# Patient Record
Sex: Female | Born: 1955 | Race: Black or African American | Hispanic: No | Smoking: Never smoker
Health system: Southern US, Community
[De-identification: ages and names within clinical notes are randomized; demographics above are authoritative.]

## PROBLEM LIST (undated history)

## (undated) HISTORY — PX: APPENDECTOMY: SHX54

## (undated) HISTORY — PX: OTHER SURGICAL HISTORY: SHX169

---

## 2002-08-03 ENCOUNTER — Ambulatory Visit (HOSPITAL_COMMUNITY): Admission: RE | Admit: 2002-08-03 | Discharge: 2002-08-03 | Payer: Self-pay | Admitting: Family Medicine

## 2004-06-24 ENCOUNTER — Ambulatory Visit: Payer: Self-pay | Admitting: Nurse Practitioner

## 2004-06-24 ENCOUNTER — Ambulatory Visit: Payer: Self-pay | Admitting: Internal Medicine

## 2004-06-25 ENCOUNTER — Ambulatory Visit: Payer: Self-pay | Admitting: *Deleted

## 2004-08-12 ENCOUNTER — Ambulatory Visit: Payer: Self-pay | Admitting: Nurse Practitioner

## 2004-08-22 ENCOUNTER — Ambulatory Visit (HOSPITAL_COMMUNITY): Admission: RE | Admit: 2004-08-22 | Discharge: 2004-08-22 | Payer: Self-pay | Admitting: Family Medicine

## 2004-09-01 ENCOUNTER — Ambulatory Visit: Payer: Self-pay | Admitting: Nurse Practitioner

## 2004-09-25 ENCOUNTER — Ambulatory Visit: Payer: Self-pay | Admitting: Nurse Practitioner

## 2004-10-09 ENCOUNTER — Ambulatory Visit: Payer: Self-pay | Admitting: Nurse Practitioner

## 2005-05-14 ENCOUNTER — Ambulatory Visit: Payer: Self-pay | Admitting: Nurse Practitioner

## 2016-01-23 ENCOUNTER — Encounter (HOSPITAL_COMMUNITY): Payer: Self-pay | Admitting: Nurse Practitioner

## 2016-01-23 ENCOUNTER — Inpatient Hospital Stay (HOSPITAL_COMMUNITY)
Admission: AD | Admit: 2016-01-23 | Discharge: 2016-01-28 | DRG: 885 | Disposition: A | Discharge status: Home / Self Care | Payer: Federal, State, Local not specified - Other | Attending: Psychiatry | Admitting: Psychiatry

## 2016-01-23 ENCOUNTER — Emergency Department (HOSPITAL_COMMUNITY): Payer: Self-pay

## 2016-01-23 ENCOUNTER — Emergency Department (HOSPITAL_COMMUNITY)
Admission: EM | Admit: 2016-01-23 | Discharge: 2016-01-23 | Disposition: A | Discharge status: Other Acute Inpatient Hospital | Payer: Self-pay | Attending: Emergency Medicine | Admitting: Emergency Medicine

## 2016-01-23 DIAGNOSIS — R440 Auditory hallucinations: Secondary | ICD-10-CM | POA: Diagnosis present

## 2016-01-23 DIAGNOSIS — F333 Major depressive disorder, recurrent, severe with psychotic symptoms: Secondary | ICD-10-CM | POA: Diagnosis present

## 2016-01-23 DIAGNOSIS — F312 Bipolar disorder, current episode manic severe with psychotic features: Secondary | ICD-10-CM | POA: Diagnosis present

## 2016-01-23 DIAGNOSIS — G4700 Insomnia, unspecified: Secondary | ICD-10-CM | POA: Diagnosis present

## 2016-01-23 DIAGNOSIS — Z803 Family history of malignant neoplasm of breast: Secondary | ICD-10-CM | POA: Diagnosis not present

## 2016-01-23 DIAGNOSIS — R000 Tachycardia, unspecified: Secondary | ICD-10-CM | POA: Insufficient documentation

## 2016-01-23 DIAGNOSIS — F411 Generalized anxiety disorder: Secondary | ICD-10-CM | POA: Diagnosis present

## 2016-01-23 DIAGNOSIS — F29 Unspecified psychosis not due to a substance or known physiological condition: Secondary | ICD-10-CM | POA: Insufficient documentation

## 2016-01-23 DIAGNOSIS — Z5181 Encounter for therapeutic drug level monitoring: Secondary | ICD-10-CM | POA: Insufficient documentation

## 2016-01-23 DIAGNOSIS — Z79899 Other long term (current) drug therapy: Secondary | ICD-10-CM | POA: Diagnosis not present

## 2016-01-23 LAB — URINALYSIS, ROUTINE W REFLEX MICROSCOPIC
Bilirubin Urine: NEGATIVE
Glucose, UA: NEGATIVE mg/dL
KETONES UR: 40 mg/dL — AB
LEUKOCYTES UA: NEGATIVE
NITRITE: NEGATIVE
PH: 5.5 (ref 5.0–8.0)
Protein, ur: NEGATIVE mg/dL
SPECIFIC GRAVITY, URINE: 1.009 (ref 1.005–1.030)

## 2016-01-23 LAB — COMPREHENSIVE METABOLIC PANEL
ALK PHOS: 105 U/L (ref 38–126)
ALT: 51 U/L (ref 14–54)
AST: 40 U/L (ref 15–41)
Albumin: 4.8 g/dL (ref 3.5–5.0)
Anion gap: 10 (ref 5–15)
BUN: 19 mg/dL (ref 6–20)
CALCIUM: 9.8 mg/dL (ref 8.9–10.3)
CO2: 24 mmol/L (ref 22–32)
CREATININE: 0.73 mg/dL (ref 0.44–1.00)
Chloride: 103 mmol/L (ref 101–111)
Glucose, Bld: 103 mg/dL — ABNORMAL HIGH (ref 65–99)
Potassium: 3 mmol/L — ABNORMAL LOW (ref 3.5–5.1)
Sodium: 137 mmol/L (ref 135–145)
Total Bilirubin: 2.2 mg/dL — ABNORMAL HIGH (ref 0.3–1.2)
Total Protein: 7.4 g/dL (ref 6.5–8.1)

## 2016-01-23 LAB — CBC WITH DIFFERENTIAL/PLATELET
BASOS PCT: 0 %
Basophils Absolute: 0 10*3/uL (ref 0.0–0.1)
Eosinophils Absolute: 0 10*3/uL (ref 0.0–0.7)
Eosinophils Relative: 0 %
HEMATOCRIT: 41 % (ref 36.0–46.0)
HEMOGLOBIN: 13.7 g/dL (ref 12.0–15.0)
Lymphocytes Relative: 16 %
Lymphs Abs: 1.3 10*3/uL (ref 0.7–4.0)
MCH: 29.7 pg (ref 26.0–34.0)
MCHC: 33.4 g/dL (ref 30.0–36.0)
MCV: 88.7 fL (ref 78.0–100.0)
MONOS PCT: 6 %
Monocytes Absolute: 0.5 10*3/uL (ref 0.1–1.0)
NEUTROS ABS: 6.3 10*3/uL (ref 1.7–7.7)
NEUTROS PCT: 78 %
Platelets: 233 10*3/uL (ref 150–400)
RBC: 4.62 MIL/uL (ref 3.87–5.11)
RDW: 12.8 % (ref 11.5–15.5)
WBC: 8 10*3/uL (ref 4.0–10.5)

## 2016-01-23 LAB — RAPID URINE DRUG SCREEN, HOSP PERFORMED
Amphetamines: NOT DETECTED
Barbiturates: NOT DETECTED
Benzodiazepines: NOT DETECTED
Cocaine: NOT DETECTED
OPIATES: NOT DETECTED
TETRAHYDROCANNABINOL: NOT DETECTED

## 2016-01-23 LAB — URINE MICROSCOPIC-ADD ON

## 2016-01-23 LAB — ETHANOL

## 2016-01-23 LAB — TSH: TSH: 0.486 u[IU]/mL (ref 0.350–4.500)

## 2016-01-23 MED ORDER — SODIUM CHLORIDE 0.9 % IV BOLUS (SEPSIS)
1000.0000 mL | Freq: Once | INTRAVENOUS | Status: AC
  Administered 2016-01-23: 1000 mL via INTRAVENOUS

## 2016-01-23 MED ORDER — ALUM & MAG HYDROXIDE-SIMETH 200-200-20 MG/5ML PO SUSP: 30.0000 mL | ORAL | Status: DC | PRN

## 2016-01-23 MED ORDER — IBUPROFEN 200 MG PO TABS: 600.0000 mg | ORAL_TABLET | Freq: Three times a day (TID) | ORAL | Status: DC | PRN

## 2016-01-23 MED ORDER — LORAZEPAM 1 MG PO TABS: 1.0000 mg | ORAL_TABLET | Freq: Three times a day (TID) | ORAL | Status: DC | PRN

## 2016-01-23 MED ORDER — ACETAMINOPHEN 325 MG PO TABS
650.0000 mg | ORAL_TABLET | ORAL | Status: DC | PRN
  Administered 2016-01-27: 650 mg via ORAL
  Filled 2016-01-23: qty 2

## 2016-01-23 MED ORDER — MAGNESIUM HYDROXIDE 400 MG/5ML PO SUSP
30.0000 mL | Freq: Every day | ORAL | Status: DC | PRN
  Administered 2016-01-24 – 2016-01-25 (×2): 30 mL via ORAL
  Filled 2016-01-23 (×2): qty 30

## 2016-01-23 MED ORDER — ACETAMINOPHEN 325 MG PO TABS
650.0000 mg | ORAL_TABLET | ORAL | Status: DC | PRN
  Administered 2016-01-23: 650 mg via ORAL
  Filled 2016-01-23: qty 2

## 2016-01-23 MED ORDER — IBUPROFEN 600 MG PO TABS
600.0000 mg | ORAL_TABLET | Freq: Three times a day (TID) | ORAL | Status: DC | PRN
  Administered 2016-01-27: 600 mg via ORAL
  Filled 2016-01-23: qty 1

## 2016-01-23 MED ORDER — POTASSIUM CHLORIDE CRYS ER 20 MEQ PO TBCR
40.0000 meq | EXTENDED_RELEASE_TABLET | Freq: Once | ORAL | Status: AC
  Administered 2016-01-23: 40 meq via ORAL
  Filled 2016-01-23: qty 2

## 2016-01-23 NOTE — BH Assessment (Addendum)
Tele Assessment Note   Rollen SoxJoyce Hopman is an 60 y.o. female. Pt denies SI/HI. Pt reports auditory hallucinations. Per Pt she hears a voice "talking foul and filthy." Pt states the voice is negative and tries to lower her self-esteem. Pt states the voice will not allow her to sleep. Pt states that she is hearing the voice because "the world is evil." Pt attempted to file a police report because she feels that someone is trying to have mind control over her. Pt denies previous mental health history. Pt denies mental health medication. Pt states informed her family about the voice and they became very concerned about her. Pt denies SA and abuse.   Pt's family state the Pt has been talking back to the voice and wondering off.   Writer consulted with Catha NottinghamJamison, DNP. Per Catha NottinghamJamison Pt meets inpatient criteria. TTS to seek gero-psych.   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 No pertinent past medical history.  History reviewed. No pertinent surgical history.  Family History: History reviewed. No pertinent family history.  Social History:  reports that she does not drink alcohol or use drugs. Her tobacco history is not on file.  Additional Social History:  Alcohol / Drug Use Pain Medications: Pt denies Prescriptions: pt denies Over the Counter: Pt denies History of alcohol / drug use?: No history of alcohol / drug abuse Longest period of sobriety (when/how long): NA  CIWA: CIWA-Ar BP: 146/98 Pulse Rate: (!) 125 COWS:    PATIENT STRENGTHS: (choose at least two) Average or above average intelligence Communication skills  Allergies: No Known Allergies  Home Medications:  (Not in a hospital admission)  OB/GYN Status:  No LMP recorded. Patient has had a hysterectomy.  General Assessment Data [facility] of [facility]: [facility] TTS Assessment: In system Is this a Tele or Face-to-Face Assessment?: Face-to-Face Is this an Initial Assessment or a Re-assessment  for this encounter?: Initial Assessment Marital status: Divorced BoazMaiden name: NA Is patient pregnant?: No Pregnancy Status: No Living Arrangements: Children Can pt return to current living arrangement?: Yes Admission Status: Voluntary Is patient capable of signing voluntary admission?: Yes Referral Source: Self/Family/Friend Insurance type: SP     Crisis Care Plan Living Arrangements: Children Legal Guardian: Other: (self) Name of Psychiatrist: Na Name of Therapist: NA  Education Status Is patient currently in school?: No Current Grade: NA Highest grade of school patient has completed: 12 Name of school: NA Contact person: NA  Risk to self with the past 6 months Suicidal Ideation: No Has patient been a risk to self within the past 6 months prior to admission? : No Suicidal Intent: No Has patient had any suicidal intent within the past 6 months prior to admission? : No Is patient at risk for suicide?: No Suicidal Plan?: No Has patient had any suicidal plan within the past 6 months prior to admission? : No Access to Means: No What has been your use of drugs/alcohol within the last 12 months?: NA Previous Attempts/Gestures: No How many times?: 0 Other Self Harm Risks: Na Triggers for Past Attempts: None known Intentional Self Injurious Behavior: None Family Suicide History: No Recent stressful life event(s): Other (Comment) (Pt denies) Persecutory voices/beliefs?: Yes Depression: No Depression Symptoms:  (Pt denies) Substance abuse history and/or treatment for substance abuse?: No Suicide prevention information given to non-admitted patients: Not applicable  Risk to Others within the past 6 months Homicidal Ideation: No Does patient have any lifetime risk of violence toward others beyond the  six months prior to admission? : No Thoughts of Harm to Others: No Current Homicidal Intent: No Current Homicidal Plan: No Access to Homicidal Means: No Identified Victim:  NA History of harm to others?: No Assessment of Violence: None Noted Violent Behavior Description: NA Does patient have access to weapons?: No Criminal Charges Pending?: No Does patient have a court date: No Is patient on probation?: No  Psychosis Hallucinations: None noted Delusions: None noted  Mental Status Report Appearance/Hygiene: Unremarkable Eye Contact: Fair Motor Activity: Freedom of movement Speech: Logical/coherent Level of Consciousness: Alert Mood: Euthymic Affect: Appropriate to circumstance Anxiety Level: None Thought Processes: Coherent, Relevant Judgement: Unimpaired Orientation: Person, Time, Place, Situation Obsessive Compulsive Thoughts/Behaviors: None  Cognitive Functioning Concentration: Normal Memory: Recent Intact, Remote Intact IQ: Average Insight: Fair Impulse Control: Fair Appetite: Poor Weight Loss: 0 Weight Gain: 0 Sleep: Decreased Total Hours of Sleep: 6 Vegetative Symptoms: None  ADLScreening [facility] Assessment Services) Patient's cognitive ability adequate to safely complete daily activities?: Yes Patient able to express need for assistance with ADLs?: Yes Independently performs ADLs?: Yes (appropriate for developmental age)  Prior Inpatient Therapy Prior Inpatient Therapy: No Prior Therapy Dates: Na Prior Therapy Facilty/Provider(s): NA Reason for Treatment: NA  Prior Outpatient Therapy Prior Outpatient Therapy: No Prior Therapy Dates: Na Prior Therapy Facilty/Provider(s): NA Reason for Treatment: NA Does patient have an ACCT team?: No Does patient have Intensive In-House Services?  : No Does patient have [facility] services? : No Does patient have P4CC services?: No  ADL Screening (condition at time of admission) Patient's cognitive ability adequate to safely complete daily activities?: Yes Is the patient deaf or have difficulty hearing?: No Does the patient have difficulty seeing, even when wearing glasses/contacts?:  No Does the patient have difficulty concentrating, remembering, or making decisions?: No Patient able to express need for assistance with ADLs?: Yes Does the patient have difficulty dressing or bathing?: No Independently performs ADLs?: Yes (appropriate for developmental age) Does the patient have difficulty walking or climbing stairs?: No Weakness of Legs: None Weakness of Arms/Hands: None       Abuse/Neglect Assessment (Assessment to be complete while patient is alone) Physical Abuse: Denies Verbal Abuse: Denies Sexual Abuse: Denies Exploitation of patient/patient's resources: Denies Self-Neglect: Denies     Merchant navy officerAdvance Directives (For Healthcare) Does patient have an advance directive?: No Would patient like information on creating an advanced directive?: No - patient declined information    Additional Information 1:1 In Past 12 Months?: No CIRT Risk: No Elopement Risk: No Does patient have medical clearance?: No     Disposition:  Disposition Initial Assessment Completed for this Encounter: Yes Disposition of Patient: Inpatient treatment program Type of inpatient treatment program: Adult  Emmit PomfretLevette,Shenique Childers D 01/23/2016 12:46 PM

## 2016-01-23 NOTE — ED Triage Notes (Signed)
Patients friend asked for her to be IVC. States patient has not been eating or sleeping and wonders. States patient is hearing voices and talking to imaginary people and saying that someone is in her head and touching her.

## 2016-01-23 NOTE — BH Assessment (Signed)
[facility] Assessment Progress Note  Per Nanine MeansJamison Lord, DNP, this pt requires psychiatric hospitalization at this time.  Lillia AbedLindsay, RN, [facility]; they will be ready to receive pt at 19:30.  Pt has signed Voluntary Admission and Consent for Treatment, as well as Consent to Release Information to no one, and signed [facility].  Pt's nurse has been notified, and agrees to send original paperwork along with pt via [place], and to call report to [phone].  Doylene Canninghomas Celena Lanius, MA Triage Specialist [phone]

## 2016-01-23 NOTE — Progress Notes (Signed)
Entered in d/c instructions Please use the resources provided to you in emergency room by case manager to assist you're your choice of doctor for follow up     These [place] uninsured resources provide possible primary care providers, resources for discounted medications, housing, dental resources, affordable care act information, plus other resources [place]    Instructions: please use the resources provided to your by the partnership for community care staff member on 01/23/16 in [facility]

## 2016-01-23 NOTE — ED Provider Notes (Signed)
[facility]Y DEPT Provider Note   CSN: [id number] Arrival date & time: 01/23/16  [id number]     History   Chief Complaint Chief Complaint  Patient presents with  . IVC   Level 5 caveat due to psychiatric disorder. HPI Selena [name] is a 60 y.o. female.  The history is provided by the patient.  Patient presents under involuntary commitment. Reportedly has been hallucinating for the last few months. Has not been eating sleeping. Has been hearing voices. States it is a female voice. States she's never had problems with this before the last few months. States that it is saying bad things to her. She denies substance abuse. States she's been researching on the Internet a lot of people are dealing with this. Denies headache. Denies confusion. IVC paperwork states that she was found out walking in her nightgown today. She does not have a doctor that she sees.     History reviewed. No pertinent past medical history.  There are no active problems to display for this patient.   History reviewed. No pertinent surgical history.  OB History    No data available       Home Medications    Prior to Admission medications   Not on File    Family History History reviewed. No pertinent family history.  Social History Social History  Substance Use Topics  . Smoking status: Unknown If Ever Smoked  . Smokeless tobacco: Not on file  . Alcohol use No     Allergies   Review of patient's allergies indicates no known allergies.   Review of Systems Review of Systems  Unable to perform ROS: Psychiatric disorder  Constitutional: Positive for appetite change.  Neurological: Negative for headaches.  Psychiatric/Behavioral: Positive for confusion.     Physical Exam Updated Vital Signs BP 160/84   Pulse (!) 125   Temp 98.3 F (36.8 C) (Oral)   Resp 16   SpO2 99%   Physical Exam  Constitutional: She is oriented to person, place, and time. She appears well-developed and  well-nourished.  HENT:  Head: Atraumatic.  Eyes: Pupils are equal, round, and reactive to light.  Neck: Neck supple.  Cardiovascular:  No murmur heard. tachycardia  Pulmonary/Chest: Effort normal.  Abdominal: Soft. There is no tenderness.  Musculoskeletal: She exhibits no edema.  Neurological: She is alert and oriented to person, place, and time.  Skin: Skin is warm. Capillary refill takes less than 2 seconds.  Psychiatric: She has a normal mood and affect.     ED Treatments / Results  Labs (all labs ordered are listed, but only abnormal results are displayed) Labs Reviewed  COMPREHENSIVE METABOLIC PANEL - Abnormal; Notable for the following:       Result Value   Potassium 3.0 (*)    Glucose, Bld 103 (*)    Total Bilirubin 2.2 (*)    All other components within normal limits  URINALYSIS, ROUTINE W REFLEX MICROSCOPIC (NOT AT [facility]) - Abnormal; Notable for the following:    Hgb urine dipstick TRACE (*)    Ketones, ur 40 (*)    All other components within normal limits  URINE MICROSCOPIC-ADD ON - Abnormal; Notable for the following:    Squamous Epithelial / LPF 0-5 (*)    Bacteria, UA RARE (*)    All other components within normal limits  ETHANOL  RAPID URINE DRUG SCREEN, HOSP PERFORMED  CBC WITH DIFFERENTIAL/PLATELET  TSH    EKG  EKG Interpretation  Date/Time:  Thursday January 23 2016 09:43:16  EDT Ventricular Rate:  126 PR Interval:    QRS Duration: 81 QT Interval:  295 QTC Calculation: 427 R Axis:   36 Text Interpretation:  Sinus tachycardia Confirmed by Rubin PayorPICKERING  MD, Harrold DonathNATHAN [phone]) on 01/23/2016 9:56:08 AM Also confirmed by Rubin PayorPICKERING  MD, Alexey Rhoads [phone]), editor DelcambreLOGAN, Cala BradfordKIMBERLY [phone])  on 01/23/2016 10:42:49 AM       Radiology Dg Chest 2 View  Result Date: 01/23/2016 CLINICAL DATA:  Mental status change.  Confusion. EXAM: CHEST  2 VIEW COMPARISON:  None in PACs FINDINGS: The lungs are well-expanded and clear. The heart and pulmonary vascularity are normal.  There is calcification in the wall of the aortic arch. There is no pleural effusion or pneumothorax. The bony thorax is unremarkable. IMPRESSION: There is no active cardiopulmonary disease. Aortic atherosclerosis. Electronically Signed   By: David  Swazil[place]Jordan M.D.   On: 01/23/2016 10:05   Ct Head Wo Contrast  Result Date: 01/23/2016 CLINICAL DATA:  Patients friend asked for her to be IVC. States patient has not been eating or sleeping and wonders. States patient is hearing voices and talking to imaginary people and saying that someone is in her head and touching her. EXAM: CT HEAD WITHOUT CONTRAST TECHNIQUE: Contiguous axial images were obtained from the base of the skull through the vertex without intravenous contrast. COMPARISON:  None. FINDINGS: Brain: No evidence of acute infarction, hemorrhage, hydrocephalus, extra-axial collection or mass lesion/mass effect. Vascular: No hyperdense vessel or unexpected calcification. Skull: Normal. Negative for fracture or focal lesion. Sinuses/Orbits: Clear visualized sinuses. Visualize globes and orbits are unremarkable. Other: None. IMPRESSION: Normal unenhanced CT scan of the brain. Electronically Signed   By: Amie Portlandavid  Ormond M.D.   On: 01/23/2016 10:27    Procedures Procedures (including critical care time)  Medications Ordered in ED Medications  ibuprofen (ADVIL,MOTRIN) tablet 600 mg (not administered)  acetaminophen (TYLENOL) tablet 650 mg (not administered)  LORazepam (ATIVAN) tablet 1 mg (not administered)  sodium chloride 0.9 % bolus 1,000 mL (0 mLs Intravenous Stopped 01/23/16 1158)     Initial Impression / Assessment and Plan / ED Course  I have reviewed the triage vital signs and the nursing notes.  Pertinent labs & imaging results that were available during my care of the patient were reviewed by me and considered in my medical decision making (see chart for details).  Clinical Course    Patient with hallucinations. No reported psychiatric  history. Appears medically cleared. To be seen by TTS.  Final Clinical Impressions(s) / ED Diagnoses   Final diagnoses:  Auditory hallucinations    New Prescriptions New Prescriptions   No medications on file     Benjiman CoreNathan Brookelynn Hamor, MD 01/23/16 1401

## 2016-01-23 NOTE — ED Notes (Signed)
Pt personal belongings moved from locker 27 to locker 31.

## 2016-01-23 NOTE — ED Notes (Signed)
Patient brought in by [employer]. Patient was IVC by a friend. He states she has been talking to herself and has imaginary friends.

## 2016-01-23 NOTE — Progress Notes (Signed)
[facility] staff this am Pt with resources from [facility] on her bedside table Pt confirms no pcp and she will use resources to find a pcp for f/u care   CM discussed and provided written information to assist pt with determining choice for uninsured accepting pcps, discussed the importance of pcp vs EDP services for f/u care, www.needymeds.org, www.goodrx.com, discounted pharmacies and other [place] resources such as [facility] , [facility], affordable care act, financial assistance, uninsured dental services, [place] med assist, DSS and  health department  Reviewed resources for [place] uninsured accepting pcps like [facility], family medicine [facility] [address], [facility], local urgent care centers, [facility], [facility], general medical clinics, [facility] services of the [place], [facility] plus others, medication resources, [facility] out [facility]ies and housing Pt voiced understanding and appreciation of resources provided   Provided P4CC contact information  Pt with female visitor at her bedside Pleasant and cooperative

## 2016-01-24 ENCOUNTER — Encounter (HOSPITAL_COMMUNITY): Payer: Self-pay

## 2016-01-24 DIAGNOSIS — F312 Bipolar disorder, current episode manic severe with psychotic features: Secondary | ICD-10-CM | POA: Clinically undetermined

## 2016-01-24 MED ORDER — OLANZAPINE 10 MG IM SOLR: 5.0000 mg | Freq: Three times a day (TID) | INTRAMUSCULAR | Status: DC | PRN

## 2016-01-24 MED ORDER — OLANZAPINE 5 MG PO TABS: 5.0000 mg | ORAL_TABLET | Freq: Three times a day (TID) | ORAL | Status: DC | PRN

## 2016-01-24 MED ORDER — OLANZAPINE 2.5 MG PO TABS
2.5000 mg | ORAL_TABLET | Freq: Every day | ORAL | Status: DC
  Administered 2016-01-24 – 2016-01-27 (×4): 2.5 mg via ORAL
  Filled 2016-01-24 (×6): qty 1
  Filled 2016-01-24: qty 7

## 2016-01-24 MED ORDER — HYDROXYZINE HCL 25 MG PO TABS
25.0000 mg | ORAL_TABLET | Freq: Four times a day (QID) | ORAL | Status: DC | PRN
  Filled 2016-01-24: qty 10

## 2016-01-24 NOTE — Progress Notes (Signed)
D: Pt was in the day room talking to peers upon initial approach.  Pt presents with euphoric affect and pleasant mood.  Pt states "i had a good day, the patients and the staff here are so nice and helpful."  Pt reports the best part of her day was "the group session, everybody was expressing themselves and it helped lift my spirits."  Pt denies SI/HI, denies hallucinations, denies pain.  Pt has been visible in milieu interacting with peers and staff appropriately.  Pt attended evening group.   A: Introduced self to pt.  Actively listened to pt and offered support and encouragement. Medication administered per order.   R: Pt is safe on the unit.  Pt is compliant with medication.  Pt verbally contracts for safety.  Will continue to monitor and assess.

## 2016-01-24 NOTE — [facility] Group Notes (Signed)
[facility] LCSW Group Therapy  01/24/2016  1:05 PM  Type of Therapy:  Group therapy  Participation Level:  Active  Participation Quality:  Attentive  Affect:  Flat  Cognitive:  Oriented  Insight:  Limited  Engagement in Therapy:  Limited  Modes of Intervention:  Discussion, Socialization  Summary of Progress/Problems:  Chaplain was here to lead a group on themes of hope and courage.  "I think we were created to control our own lives, not to turn the control over to others. If you live life a long time, you should learn to practice self control."  "We need strength from above to know who we are?"  "I have felt the love and care from everyone here since coming in."  Engaged throughout.  Stayed the entire time.  Daryel Geraldorth, Estevan Kersh B 01/24/2016 12:52 PM

## 2016-01-24 NOTE — H&P (Addendum)
Psychiatric Admission Assessment Adult  Patient Identification: Selena [name] MRN:  [id number] Date of Evaluation:  01/24/2016 Chief Complaint:Pt states " I have been hearing these evil voices since the past several months.'   Principal Diagnosis:  R/O Bipolar disorder, current episode manic severe with psychotic features ([place]) versus schizoaffective disorder Diagnosis:   Patient Active Problem List   Diagnosis Date Noted  . Bipolar disorder, current episode manic severe with psychotic features ([place]) [F31.2] 01/24/2016   History of Present Illness: Selena [name] is a 60 y.o. caucasian female, who has no past hx of mental illness, is divorced , unemployed , lives in [place] with her foster son , who presented to [facility] brought in by a friend for psychosis.  Per initial notes in EHR : " Pt reports auditory hallucinations. Per Pt she hears a voice "talking foul and filthy." Pt states the voice is negative and tries to lower her self-esteem. Pt states the voice will not allow her to sleep. Pt states that she is hearing the voice because "the world is evil." Pt attempted to file a police report because she feels that someone is trying to have mind control over her. Pt denies previous mental health history. Pt denies mental health medication. Pt states informed her family about the voice and they became very concerned about her. Pt denies SA and abuse. Pt's family state the Pt has been talking back to the voice and wandering off. "  Patient seen and chart reviewed today .Discussed patient with treatment team. Pt today seen as anxious , pressured , however is very delusional . Pt goes ona nd on about her voices - states that it started several months ago. Pt reports that these voices are "filthy" talks down on her. Pt reports that the voices are always controlling , knows all about her , all about her childhood and she feels they are trying to control her mind . Pt reports that these voices does things to her  private parts and they are usually sexual in nature. Pt reports she has been fighting back , trying to cope , and giving control to God who knows who she is . Pt reports that these voices will control her if she goes in to deep sleep , hence she does not sleep and stays busy . Pt reports she does not know how her energy is , but she does not feel the need to sleep even when she does not sleep at night. Pt reports she copes with the voices by " staying busy " all day and night . Pt is hyper religious - talks at length about god and higher power. Pt denies any mood sx - however appears manic versus hypomanic . Pt also reports VH - of images , pt also reports paranoid ideation - states " this is all a set up .' Pt reports she does not take any drugs or illicit substances - and is against taking medications. Pt denies past hx of depression /anxiety/ psychosis and IP admissions. Pt denies any SI or past attempts. Pt denies hx of sexual or physical abuse.   Associated Signs/Symptoms: Depression Symptoms:  insomnia, psychomotor agitation, difficulty concentrating, anxiety, (Hypo) Manic Symptoms:  Delusions, Elevated Mood, Hallucinations, Impulsivity, Labiality of Mood, Anxiety Symptoms:  anxiety unspecified Psychotic Symptoms:  Delusions, Hallucinations: Auditory Visual Paranoia, PTSD Symptoms: Negative Total Time spent with patient: 1 hour  Past Psychiatric History: Patient reports hx of mild depressive sx in the past , never been diagnosed or did  not feel like she needed help. Pt denies IP admission, denies suicide attempts.  Is the patient at risk to self? Yes.    Has the patient been a risk to self in the past 6 months? No.  Has the patient been a risk to self within the distant past? No.  Is the patient a risk to others? Yes.    Has the patient been a risk to others in the past 6 months? No.  Has the patient been a risk to others within the distant past? No.   Prior Inpatient  Therapy:   Prior Outpatient Therapy:    Alcohol Screening: 1. How often do you have a drink containing alcohol?: Never 9. Have you or someone else been injured as a result of your drinking?: No 10. Has a relative or friend or a doctor or another health worker been concerned about your drinking or suggested you cut down?: No Alcohol Use Disorder Identification Test Final Score (AUDIT): 0 Brief Intervention: AUDIT score less than 7 or less-screening does not suggest unhealthy drinking-brief intervention not indicated Substance Abuse History in the last 12 months:  No. Consequences of Substance Abuse: Negative Previous Psychotropic Medications: No  Psychological Evaluations: No  Past Medical History: History reviewed. No pertinent past medical history.  Past Surgical History:  Procedure Laterality Date  . APPENDECTOMY    . cesarian section     Family History:  Family History  Problem Relation Age of Onset  . Breast cancer Mother   . Mental illness Neg Hx    Family Psychiatric  History: Denies Tobacco Screening: Have you used any form of tobacco in the last 30 days? (Cigarettes, Smokeless Tobacco, Cigars, and/or Pipes): No Social History: Patient went up to 12 th grade, used to work in Scientist, research (medical), senior care, is currently unemployed, has been taking care of her foster son since he were 6 months , he is now 45 years old, she has his guardianship, gets support from social services for taking care of him and that is how she supports self, her family is [place] , she does have some good friends here , denies legal issues . History  Alcohol Use No     History  Drug Use No    Additional Social History:      Pain Medications: Pt denies Prescriptions: pt denies Over the Counter: Pt denies History of alcohol / drug use?: No history of alcohol / drug abuse Longest period of sobriety (when/how long): NA                    Allergies:  No Known Allergies Lab Results:  Results for  orders placed or performed during the hospital encounter of 01/23/16 (from the past 48 hour(s))  Comprehensive metabolic panel     Status: Abnormal   Collection Time: 01/23/16  9:58 AM  Result Value Ref Range   Sodium 137 135 - 145 mmol/L   Potassium 3.0 (L) 3.5 - 5.1 mmol/L   Chloride 103 101 - 111 mmol/L   CO2 24 22 - 32 mmol/L   Glucose, Bld 103 (H) 65 - 99 mg/dL   BUN 19 6 - 20 mg/dL   Creatinine, Ser 0.73 0.44 - 1.00 mg/dL   Calcium 9.8 8.9 - 10.3 mg/dL   Total Protein 7.4 6.5 - 8.1 g/dL   Albumin 4.8 3.5 - 5.0 g/dL   AST 40 15 - 41 U/L   ALT 51 14 - 54 U/L   Alkaline Phosphatase 105  38 - 126 U/L   Total Bilirubin 2.2 (H) 0.3 - 1.2 mg/dL   GFR calc non Af Amer >60 >60 mL/min   GFR calc Af Amer >60 >60 mL/min    Comment: (NOTE) The eGFR has been calculated using the CKD EPI equation. This calculation has not been validated in all clinical situations. eGFR's persistently <60 mL/min signify possible Chronic Kidney Disease.    Anion gap 10 5 - 15  CBC with Differential     Status: None   Collection Time: 01/23/16  9:58 AM  Result Value Ref Range   WBC 8.0 4.0 - 10.5 K/uL   RBC 4.62 3.87 - 5.11 MIL/uL   Hemoglobin 13.7 12.0 - 15.0 g/dL   HCT 41.0 36.0 - 46.0 %   MCV 88.7 78.0 - 100.0 fL   MCH 29.7 26.0 - 34.0 pg   MCHC 33.4 30.0 - 36.0 g/dL   RDW 12.8 11.5 - 15.5 %   Platelets 233 150 - 400 K/uL   Neutrophils Relative % 78 %   Neutro Abs 6.3 1.7 - 7.7 K/uL   Lymphocytes Relative 16 %   Lymphs Abs 1.3 0.7 - 4.0 K/uL   Monocytes Relative 6 %   Monocytes Absolute 0.5 0.1 - 1.0 K/uL   Eosinophils Relative 0 %   Eosinophils Absolute 0.0 0.0 - 0.7 K/uL   Basophils Relative 0 %   Basophils Absolute 0.0 0.0 - 0.1 K/uL  TSH     Status: None   Collection Time: 01/23/16 10:00 AM  Result Value Ref Range   TSH 0.486 0.350 - 4.500 uIU/mL    Comment: Performed by a 3rd Generation assay with a functional sensitivity of <=0.01 uIU/mL.  Ethanol     Status: None   Collection Time:  01/23/16 10:01 AM  Result Value Ref Range   Alcohol, Ethyl (B) <5 <5 mg/dL    Comment:        LOWEST DETECTABLE LIMIT FOR SERUM ALCOHOL IS 5 mg/dL FOR MEDICAL PURPOSES ONLY   Urine rapid drug screen (hosp performed)     Status: None   Collection Time: 01/23/16 10:01 AM  Result Value Ref Range   Opiates NONE DETECTED NONE DETECTED   Cocaine NONE DETECTED NONE DETECTED   Benzodiazepines NONE DETECTED NONE DETECTED   Amphetamines NONE DETECTED NONE DETECTED   Tetrahydrocannabinol NONE DETECTED NONE DETECTED   Barbiturates NONE DETECTED NONE DETECTED    Comment:        DRUG SCREEN FOR MEDICAL PURPOSES ONLY.  IF CONFIRMATION IS NEEDED FOR ANY PURPOSE, NOTIFY LAB WITHIN 5 DAYS.        LOWEST DETECTABLE LIMITS FOR URINE DRUG SCREEN Drug Class       Cutoff (ng/mL) Amphetamine      1000 Barbiturate      200 Benzodiazepine   563 Tricyclics       149 Opiates          300 Cocaine          300 THC              50   Urinalysis, Routine w reflex microscopic     Status: Abnormal   Collection Time: 01/23/16 10:01 AM  Result Value Ref Range   Color, Urine YELLOW YELLOW   APPearance CLEAR CLEAR   Specific Gravity, Urine 1.009 1.005 - 1.030   pH 5.5 5.0 - 8.0   Glucose, UA NEGATIVE NEGATIVE mg/dL   Hgb urine dipstick TRACE (A) NEGATIVE   Bilirubin  Urine NEGATIVE NEGATIVE   Ketones, ur 40 (A) NEGATIVE mg/dL   Protein, ur NEGATIVE NEGATIVE mg/dL   Nitrite NEGATIVE NEGATIVE   Leukocytes, UA NEGATIVE NEGATIVE  Urine microscopic-add on     Status: Abnormal   Collection Time: 01/23/16 10:01 AM  Result Value Ref Range   Squamous Epithelial / LPF 0-5 (A) NONE SEEN   WBC, UA 0-5 0 - 5 WBC/hpf   RBC / HPF 0-5 0 - 5 RBC/hpf   Bacteria, UA RARE (A) NONE SEEN    Blood Alcohol level:  Lab Results  Component Value Date   ETH <5 56/97/9480    Metabolic Disorder Labs:  No results found for: HGBA1C, MPG No results found for: PROLACTIN No results found for: CHOL, TRIG, HDL, CHOLHDL, VLDL,  LDLCALC  Current Medications: Current Facility-Administered Medications  Medication Dose Route Frequency Provider Last Rate Last Dose  . acetaminophen (TYLENOL) tablet 650 mg  650 mg Oral Q4H PRN Patrecia Pour, NP      . alum & mag hydroxide-simeth (MAALOX/MYLANTA) 200-200-20 MG/5ML suspension 30 mL  30 mL Oral Q4H PRN Patrecia Pour, NP      . hydrOXYzine (ATARAX/VISTARIL) tablet 25 mg  25 mg Oral Q6H PRN Ursula Alert, MD      . ibuprofen (ADVIL,MOTRIN) tablet 600 mg  600 mg Oral Q8H PRN Patrecia Pour, NP      . magnesium hydroxide (MILK OF MAGNESIA) suspension 30 mL  30 mL Oral Daily PRN Patrecia Pour, NP   30 mL at 01/24/16 0010  . OLANZapine (ZYPREXA) tablet 5 mg  5 mg Oral TID PRN Ursula Alert, MD       Or  . OLANZapine (ZYPREXA) injection 5 mg  5 mg Intramuscular TID PRN Stephanne Greeley, MD      . OLANZapine (ZYPREXA) tablet 2.5 mg  2.5 mg Oral QHS Ursula Alert, MD       PTA Medications: No prescriptions prior to admission.    Musculoskeletal: Strength & Muscle Tone: within normal limits Gait & Station: normal Patient leans: N/A  Psychiatric Specialty Exam: Physical Exam  Nursing note and vitals reviewed. Constitutional:  I concur with PE done in ED    Review of Systems  Psychiatric/Behavioral: Positive for hallucinations. The patient is nervous/anxious and has insomnia.   All other systems reviewed and are negative.   Blood pressure 136/84, pulse (!) 116, temperature 98.3 F (36.8 C), temperature source Oral, resp. rate 16, height 5' 3"  (1.6 m), weight 65.3 kg (144 lb), SpO2 100 %.Body mass index is 25.51 kg/m.  General Appearance: Fairly Groomed  Eye Contact:  Fair  Speech:  Pressured  Volume:  Normal  Mood:  Anxious  Affect:  Appropriate  Thought Process:  Goal Directed and Descriptions of Associations: Circumstantial  Orientation:  Full (Time, Place, and Person)  Thought Content:  Delusions, Hallucinations: Auditory Visual and Paranoid Ideation   Suicidal Thoughts:  No is delusional and paranoid and hence a danger to self or others  Homicidal Thoughts:  No  Memory:  Immediate;   Fair Recent;   Fair Remote;   Fair  Judgement:  Impaired  Insight:  Shallow  Psychomotor Activity:  Increased  Concentration:  Concentration: Fair and Attention Span: Fair  Recall:  [employer] of Knowledge:  Fair  Language:  Fair  Akathisia:  No  Handed:  Right  AIMS (if indicated):     Assets:  Communication Skills Desire for Improvement  ADL's:  Intact  Cognition:  WNL  Sleep:  Number of Hours: 3.75    Treatment Plan Summary:Glynn Pflum is a 60 y.o. caucasian female, who has no past hx of mental illness, is divorced , unemployed , lives in [place] with her foster son , who presented to [facility] brought in by a friend for psychosis.  Patient today seen as pressured , psychotic, has sleep issues , is hyper religious , euphoric - hence likely meets criteria for Bipolar disorder , although she is unable to comment on her energy level or mood. Will observe pt on the unit and start treatment. Discussed medications with patient - pt agrees to a small dose of Zyprexa - will start the same. Daily contact with patient to assess and evaluate symptoms and progress in treatment and Medication management   Patient will benefit from inpatient treatment and stabilization.  Estimated length of stay is 5-7 days.  Reviewed past medical records,treatment plan.  Will start a trial of Zyprexa 2.5 mg po qhs for psychosis. Patient is against taking medications - will consider a mood stabilizer if she is agreeable like Lamictal. Will make available PRN medications as per agitation protocol. Will continue to monitor vitals ,medication compliance and treatment side effects while patient is here.  Will monitor for medical issues as well as call consult as needed.  Reviewed labs K+ - low - replaced in ED- will repeat , cbc - wnl, cmp - shows - total bilirubin slightly high -  will repeat hepatic function , acute hepatitis panel, uds- negative , ua - wnl, will get RPR , vitamin b12, folate ,will get EKG for qtc if not already done, CT scan brain - no acute pathology. CSW will start working on disposition.  Patient to participate in therapeutic milieu .      Observation Level/Precautions:  15 minute checks    Psychotherapy:  Individual and group therapy     Consultations:  CSW/RT  Discharge Concerns:  Stability and safety       Physician Treatment Plan for Primary Diagnosis: Bipolar disorder, current episode manic severe with psychotic features ([place]) Long Term Goal(s): Improvement in symptoms so as ready for discharge  Short Term Goals: Ability to identify and develop effective coping behaviors will improve and Compliance with prescribed medications will improve  Physician Treatment Plan for Secondary Diagnosis: Principal Problem:   Bipolar disorder, current episode manic severe with psychotic features ([place])  Long Term Goal(s): Improvement in symptoms so as ready for discharge  Short Term Goals: Ability to identify and develop effective coping behaviors will improve and Compliance with prescribed medications will improve  I certify that inpatient services furnished can reasonably be expected to improve the patient's condition.    Fatoumata Albaugh, MD 11/3/201712:21 PM

## 2016-01-24 NOTE — Progress Notes (Signed)
Recreation Therapy Notes  Date: 01/24/16 Time: 1000 Location: 500 Hall Dayroom  Group Topic: Communication  Goal Area(s) Addresses:  Patient will effectively communicate with peers in group.  Patient will verbalize benefit of healthy communication. Patient will verbalize positive effect of healthy communication on post d/c goals.  Patient will identify communication techniques that made activity effective for group.   Intervention:  Jenga, cards, UNO, checkers, tables and chairs   Activity: Leisure Stations.  LRT set up activity stations in the dayroom with activities such as UNO, Jenga, spades and checkers.  Patients were to pick an activity to participate in.  Patients were to use problem solving, coordination and communication skills to complete the activities.  Education: Communication, Discharge Planning  Education Outcome: Acknowledges understanding/In group clarification offered/Needs additional education.   Clinical Observations/Feedback: Pt did not attend group.   Camyra Vaeth, LRT/CTRS         Etienne Mowers A 01/24/2016 12:51 PM 

## 2016-01-24 NOTE — Progress Notes (Signed)
Selena [name] is a 60 year old female being admitted involuntarily to [facility] from [facility]. She presented to the ED with auditory hallucinations.  The voices are "talking foul and filthy." Pt stated the voice is negative, tries to lower her self-esteem and will not allow her to sleep. She denies any SI/HI.  Pt states the voice will not allow her to sleep.  She believes that she is hearing the voice because "the world is evil." She attempted to file a police report because she feels that someone is trying to have mind control over her. She denied any mental health treatment or medication in the past.  Her family reported that she has been talking back to the voice and wondering off.  She is diagnosed with Psychotic disorder.  She continues to report hearing voices but denies that she was having problems and feels like she has been put in the hospital unjustly.  "I was trying to work on this with my friend and do research."  She does feel like evil is controlling this but just wants the voices to stop.  She reported that she doesn't want to take medications because "I don't believe in them, I don't even like to take tylenol unless I really have to."  Oriented her to the unit.  Admission paperwork completed and signed.  Belongings searched and secured in locker # 25.  Skin assessment completed and noted c-section scar.  Q 15 minute checks initiated for safety.  We will monitor the progress towards her goals.

## 2016-01-24 NOTE — Progress Notes (Signed)
Nursing Progress Note 7a-7p  D) Patient presents pleasant and hypomanic. Patient reports sleeping well and requested to shave. Patient reports anxiety 0/10, depression 0/10, and hopelessness 0/10. Patient states that she has a nonbiological son who just started college at [employer] and she reports that she "needs to get back to my life so I can take care of him". Patient reports a granddaughter who also attends [employer] and a son who is 61 years old. Patient denies SI/HI and denies hearing voices "right now". Patient says this has never happened before but that she has been "fighting the voices for awhile now". Patient reports her goal for today is to "accept what has happened to me" and repeats that she "did not ask for this". Patient reports that her long term goals are to "learn how to deal with this so I can go home and get back to my life". Patient also reports hoping for "a new world, a cleaner world" and says she knows the bad in this world is caused by "evil". Patient reports that she feels "safe" on the unit and "the staff is loving". Patient contracts for safety at this time.   A) Emotional support and encouragement given. RN supervised while patient shaved.  Opportunities for questions or concerns presented to patient. Patient encouraged to talk to SW about financial concerns. Patient encouraged to report needs to staff as they arise. Patient encouraged to attend group and complete daily workbook given. Patient on 15min safety checks.  R) Patient receptive to interaction with nurse. Patient remains safe on the unit at this time. Patient is interactive in the milieu.

## 2016-01-24 NOTE — [facility] Suicide Risk Assessment (Signed)
[facility] INPATIENT:  Family/Significant Other Suicide Prevention Education  Suicide Prevention Education:  Education Completed; No one ,  (name of family member/significant other) has been identified by the patient as the family member/significant other with whom the patient will be residing, and identified as the person(s) who will aid the patient in the event of a mental health crisis (suicidal ideations/suicide attempt).  With written consent from the patient, the family member/significant other has been provided the following suicide prevention education, prior to the and/or following the discharge of the patient.  The suicide prevention education provided includes the following:  Suicide risk factors  Suicide prevention and interventions  National Suicide Hotline telephone number  [facility] assessment telephone number  [facility] Emergency Assistance 911  [facility] and/or Residential Mobile Crisis Unit telephone number  Request made of family/significant other to:  Remove weapons (e.g., guns, rifles, knives), all items previously/currently identified as safety concern.    Remove drugs/medications (over-the-counter, prescriptions, illicit drugs), all items previously/currently identified as a safety concern.  The family member/significant other verbalizes understanding of the suicide prevention education information provided.  The family member/significant other agrees to remove the items of safety concern listed above.  Pt did not endorse SI or thoughts of self-harm prior to or at time of admission as well as throughout hospitalization.  Selena LennertLauren C Genessis [name] 01/24/2016, 3:20 PM

## 2016-01-24 NOTE — [facility] Suicide Risk Assessment (Signed)
[facility]   Nursing information obtained from:  Patient Demographic factors:  Unemployed Current Mental Status:  NA Loss Factors:  Financial problems / change in socioeconomic status Historical Factors:  NA Risk Reduction Factors:  Living with another person, especially a relative  Total Time spent with patient: 30 minutes Principal Problem: Bipolar disorder, current episode manic severe with psychotic features ([facility]) Diagnosis:   Patient Active Problem List   Diagnosis Date Noted  . Bipolar disorder, current episode manic severe with psychotic features ([facility]) [F31.2] 01/24/2016   Subjective Data: Please see H&P.   Continued Clinical Symptoms:  Alcohol Use Disorder Identification Test Final Score (AUDIT): 0 The "Alcohol Use Disorders Identification Test", Guidelines for Use in Primary Care, Second Edition.  World Science writerHealth Organization [facility](WHO). Score between 0-7:  no or low risk or alcohol related problems. Score between 8-15:  moderate risk of alcohol related problems. Score between 16-19:  high risk of alcohol related problems. Score 20 or above:  warrants further diagnostic evaluation for alcohol dependence and treatment.   CLINICAL FACTORS:   Bipolar Disorder:  manic   Musculoskeletal: Strength & Muscle Tone: within normal limits Gait & Station: normal Patient leans: N/A  Psychiatric Specialty Exam: Physical Exam  ROS  Blood pressure 136/84, pulse (!) 116, temperature 98.3 F (36.8 C), temperature source Oral, resp. rate 16, height 5\' 3"  (1.6 m), weight 65.3 kg (144 lb), SpO2 100 %.Body mass index is 25.51 kg/m.            Please see H&P.                                               COGNITIVE FEATURES THAT CONTRIBUTE TO RISK:  Closed-mindedness, Polarized thinking and Thought constriction (tunnel vision)    SUICIDE RISK:   Mild:  Suicidal ideation of limited frequency, intensity, duration, and specificity.  There  are no identifiable plans, no associated intent, mild dysphoria and related symptoms, good self-control (both objective and subjective assessment), few other risk factors, and identifiable protective factors, including available and accessible social support.   PLAN OF CARE: Please see H&P.   I certify that inpatient services furnished can reasonably be expected to improve the patient's condition.  Armond Cuthrell, MD 01/24/2016, 11:58 AM

## 2016-01-24 NOTE — Tx Team (Signed)
Interdisciplinary Treatment and Diagnostic Plan Update  01/24/2016 Time of Session: 3:15 PM  Selena [name] MRN: [id number]  Principal Diagnosis: Bipolar disorder, current episode manic severe with psychotic features [facility]  Secondary Diagnoses: Principal Problem:   Bipolar disorder, current episode manic severe with psychotic features ([place])   Current Medications:  Current Facility-Administered Medications  Medication Dose Route Frequency Provider Last Rate Last Dose  . acetaminophen (TYLENOL) tablet 650 mg  650 mg Oral Q4H PRN Patrecia Pour, NP      . alum & mag hydroxide-simeth (MAALOX/MYLANTA) 200-200-20 MG/5ML suspension 30 mL  30 mL Oral Q4H PRN Patrecia Pour, NP      . hydrOXYzine (ATARAX/VISTARIL) tablet 25 mg  25 mg Oral Q6H PRN Ursula Alert, MD      . ibuprofen (ADVIL,MOTRIN) tablet 600 mg  600 mg Oral Q8H PRN Patrecia Pour, NP      . magnesium hydroxide (MILK OF MAGNESIA) suspension 30 mL  30 mL Oral Daily PRN Patrecia Pour, NP   30 mL at 01/24/16 0010  . OLANZapine (ZYPREXA) tablet 5 mg  5 mg Oral TID PRN Ursula Alert, MD       Or  . OLANZapine (ZYPREXA) injection 5 mg  5 mg Intramuscular TID PRN Saramma Eappen, MD      . OLANZapine (ZYPREXA) tablet 2.5 mg  2.5 mg Oral QHS Ursula Alert, MD        PTA Medications: No prescriptions prior to admission.    Treatment Modalities: Medication Management, Group therapy, Case management,  1 to 1 session with clinician, Psychoeducation, Recreational therapy.  Patient Stressors: Financial difficulties Marital or family conflict  Patient Strengths: Active sense of humor Capable of independent living Curator fund of knowledge  Physician Treatment Plan for Primary Diagnosis: Bipolar disorder, current episode manic severe with psychotic features ([place]) Long Term Goal(s): Improvement in symptoms so as ready for discharge  Short Term Goals: Ability to identify and develop effective coping behaviors will  improve Compliance with prescribed medications will improve Ability to identify and develop effective coping behaviors will improve Compliance with prescribed medications will improve  Medication Management: Evaluate patient's response, side effects, and tolerance of medication regimen.  Therapeutic Interventions: 1 to 1 sessions, Unit Group sessions and Medication administration.  Evaluation of Outcomes: Not Met  Physician Treatment Plan for Secondary Diagnosis: Principal Problem:   Bipolar disorder, current episode manic severe with psychotic features ([place])   Long Term Goal(s): Improvement in symptoms so as ready for discharge  Short Term Goals: Ability to identify and develop effective coping behaviors will improve Compliance with prescribed medications will improve Ability to identify and develop effective coping behaviors will improve Compliance with prescribed medications will improve  Medication Management: Evaluate patient's response, side effects, and tolerance of medication regimen.  Therapeutic Interventions: 1 to 1 sessions, Unit Group sessions and Medication administration.  Evaluation of Outcomes: Not Met   RN Treatment Plan for Primary Diagnosis: Bipolar disorder, current episode manic severe with psychotic features ([place]) Long Term Goal(s): Knowledge of disease and therapeutic regimen to maintain health will improve  Short Term Goals: Ability to participate in decision making will improve, Ability to verbalize feelings will improve and Compliance with prescribed medications will improve  Medication Management: RN will administer medications as ordered by provider, will assess and evaluate patient's response and provide education to patient for prescribed medication. RN will report any adverse and/or side effects to prescribing provider.  Therapeutic Interventions: 1 on 1 counseling sessions,  Psychoeducation, Medication administration, Evaluate responses to  treatment, Monitor vital signs and CBGs as ordered, Perform/monitor CIWA, COWS, AIMS and Fall Risk screenings as ordered, Perform wound care treatments as ordered.  Evaluation of Outcomes: Not Met   LCSW Treatment Plan for Primary Diagnosis: Bipolar disorder, current episode manic severe with psychotic features ([place]) Long Term Goal(s): Safe transition to appropriate next level of care at discharge, Engage patient in therapeutic group addressing interpersonal concerns.  Short Term Goals: Engage patient in aftercare planning with referrals and resources and Facilitate acceptance of mental health diagnosis and concerns  Therapeutic Interventions: Assess for all discharge needs, 1 to 1 time with Social worker, Explore available resources and support systems, Assess for adequacy in community support network, Educate family and significant other(s) on suicide prevention, Complete Psychosocial Assessment, Interpersonal group therapy.  Evaluation of Outcomes: Not Met   Progress in Treatment: Attending groups: Yes Participating in groups: Yes Taking medication as prescribed: Yes, MD continues to assess for medication changes as needed Toleration medication: Yes, no side effects reported at this time Family/Significant other contact made: No, CSW attempting to make contact with Pt's friend Ivin Booty Patient understands diagnosis: Limited insight Discussing patient identified problems/goals with staff: Yes Medical problems stabilized or resolved: Yes Denies suicidal/homicidal ideation: Yes Issues/concerns per patient self-inventory: None Other: N/A  New problem(s) identified: None identified at this time.   New Short Term/Long Term Goal(s): None identified at this time.   Discharge Plan or Barriers: Pt will return home and follow-up with outpatient services.   Reason for Continuation of Hospitalization: Hallucinations Mania Medication stabilization  Estimated Length of Stay: 3-5  days  Attendees: Patient: 01/24/2016  3:15 PM  Physician: Dr. Shea Evans 01/24/2016  3:15 PM  Nursing: Loletta Specter, RN 01/24/2016  3:15 PM  RN Care Manager: Lars Pinks, RN 01/24/2016  3:15 PM  Social Worker: Ripley Fraise, [place] 01/24/2016  3:15 PM  Recreational Therapist:  01/24/2016  3:15 PM  Other: Catalina Pizza, NP; Samuel Jester, NP 01/24/2016  3:15 PM  Other:  01/24/2016  3:15 PM  Other: 01/24/2016  3:15 PM    Scribe for Treatment Team: Gladstone Lighter, LCSW 01/24/2016 3:15 PM

## 2016-01-24 NOTE — Plan of Care (Signed)
Problem: Activity: Goal: Interest or engagement in activities will improve Outcome: Progressing Patient is attending groups and cooperative in the milieu.   Problem: Self-Care: Goal: Ability to participate in self-care as condition permits will improve Outcome: Progressing Patient was able to shave this morning under the supervision of an Rn. Patient expressed interest in self care.

## 2016-01-24 NOTE — Progress Notes (Signed)
Pt was up in the bathroom doing her hair. Pt was informed that she had a roommate and night time was for sleeping and she needed to try to get some rest. Pt stated she would try to lay down. Pt appeared to be presenting with hypomanic behaviors.

## 2016-01-24 NOTE — BHH Counselor (Signed)
Adult Comprehensive Assessment  Patient ID: Selena [name], female   DOB: 10/15/1955, 60 y.o.   MRN: [id number]  Information Source: Information source: Patient  Current Stressors:  Educational / Learning stressors: None reported Employment / Job issues: Pt is unemployed and reports not being on disability Family Relationships: None reported Surveyor, quantityinancial / Lack of resources (include bankruptcy): No income other than section 8 and food stamps per ARAM[employer] CorporationPt Housing / Lack of housing: lives in HUD housing Physical health (include injuries & life threatening diseases): None reported Social relationships: none reported Substance abuse: None reported Bereavement / Loss: None reported  Living/Environment/Situation:  Living Arrangements: Children Living conditions (as described by patient or guardian): safe and stable  How long has patient lived in current situation?: 18 years What is atmosphere in current home: Comfortable, Supportive  Family History:  Marital status: Divorced Divorced, when?: 2530yrs What types of issues is patient dealing with in the relationship?: no contact Does patient have children?: Yes How many children?: 2 How is patient's relationship with their children?: two sons; close to younger son but not as close to older son  Childhood History:  By whom was/is the patient raised?: Both parents Description of patient's relationship with caregiver when they were a child: good relationship with parents; family was important  Patient's description of current relationship with people who raised him/her: mother is deceased; father is still living and Pt is close to him Does patient have siblings?: Yes Number of Siblings: 3 Description of patient's current relationship with siblings: all siblings are still living; closer to sister Did patient suffer any verbal/emotional/physical/sexual abuse as a child?: No Did patient suffer from severe childhood neglect?: No Has patient ever been  sexually abused/assaulted/raped as an adolescent or adult?: No Was the patient ever a victim of a crime or a disaster?: No Witnessed domestic violence?: No Has patient been effected by domestic violence as an adult?: No  Education:  Highest grade of school patient has completed: 12 Currently a Consulting civil engineerstudent?: No Learning disability?: No  Employment/Work Situation:   Employment situation: Unemployed Patient's job has been impacted by current illness: No What is the longest time patient has a held a job?: Pt is unsure Where was the patient employed at that time?: unknown Has patient ever been in the [employer] and Companymilitary?: No Has patient ever served in combat?: No Did You Receive Any Psychiatric Treatment/Services While in Equities traderthe Military?: No Are There Guns or Other Weapons in Your Home?: No  Financial Resources:   Surveyor, quantityinancial resources: Sales executiveood stamps (Section 8 voucher)  Alcohol/Substance Abuse:   What has been your use of drugs/alcohol within the last 12 months?: Pt denies If attempted suicide, did drugs/alcohol play a role in this?: No Alcohol/Substance Abuse Treatment Hx: Denies past history Has alcohol/substance abuse ever caused legal problems?: No  Social Support System:   Describe Community Support System: family, friends Type of faith/religion: Ephriam KnucklesChristian How does patient's faith help to cope with current illness?: believes in God, prays  Leisure/Recreation:   Leisure and Hobbies: "a simple life"  Strengths/Needs:   What things does the patient do well?: feeling people's love, giving love, caring for others In what areas does patient struggle / problems for patient: dealing with the negative voice she hears  Discharge Plan:   Does patient have access to transportation?: No Plan for no access to transportation at discharge: city bus Will patient be returning to same living situation after discharge?: Yes Currently receiving community mental health services: No If no, would patient like  referral for services when discharged?: Yes (What county?) Medical sales representative([place]) Does patient have financial barriers related to discharge medications?: Yes Patient description of barriers related to discharge medications: no income, no insurance  Summary/Recommendations:     Patient is a 60 year old female with possible diagnoses of Bipolar disorder, current episode manic severe with psychotic features versus schizoaffective disorder. Pt presented to the hospital with auditory hallucinations, paranoia, and symptoms of mania. Pt reports primary trigger(s) for admission hearing the negative voice in her head for a prolonged period of time. Patient will benefit from crisis stabilization, medication evaluation, group therapy and psycho education in addition to case management for discharge planning. At discharge it is recommended that Pt remain compliant with established discharge plan and continued treatment.   Verdene LennertLauren C Lakishia Bourassa. 01/24/2016

## 2016-01-24 NOTE — BHH Group Notes (Signed)
Adult Psychoeducational Group Note  Date:  01/24/2016 Time:  10:56 PM  Group Topic/Focus:  Wrap-Up Group:   The focus of this group is to help patients review their daily goal of treatment and discuss progress on daily workbooks.   Participation Level:  Active  Participation Quality:  Appropriate and Attentive  Affect:  Appropriate  Cognitive:  Alert and Appropriate  Insight:  Appropriate  Engagement in Group:  Engaged  Modes of Intervention:  Discussion  Additional Comments:  Patient attended and participated in group.  Today, patient's were asked "If you could have any super power, what would it be?".  Patient stated her super power would be "make the world more peaceful and loving".  Patient rates her day a "10" with 10 being the best and states "the group session were awesome today and I met some wonderful people".  Donne Hazel P 01/24/2016, 10:56 PM

## 2016-01-24 NOTE — Tx Team (Signed)
Initial Treatment Plan 01/24/2016 1:05 AM Selena [name] ZOX:[id number]    PATIENT STRESSORS: Financial difficulties Marital or family conflict   PATIENT STRENGTHS: Active sense of humor Capable of independent living Communication skills General fund of knowledge   PATIENT IDENTIFIED PROBLEMS: Psychosis  "I just don't know what to do about the voices"  "I just want to go home"                 DISCHARGE CRITERIA:  Improved stabilization in mood, thinking, and/or behavior Verbal commitment to aftercare and medication compliance  PRELIMINARY DISCHARGE PLAN: Outpatient therapy  PATIENT/FAMILY INVOLVEMENT: This treatment plan has been presented to and reviewed with the patient, Selena [name].  The patient and family have been given the opportunity to ask questions and make suggestions.  Levin BaconHeather V Kedron Uno, RN 01/24/2016, 1:05 AM

## 2016-01-25 LAB — COMPREHENSIVE METABOLIC PANEL
ALK PHOS: 91 U/L (ref 38–126)
ALT: 41 U/L (ref 14–54)
ANION GAP: 7 (ref 5–15)
AST: 33 U/L (ref 15–41)
Albumin: 4.6 g/dL (ref 3.5–5.0)
BUN: 11 mg/dL (ref 6–20)
CALCIUM: 9.8 mg/dL (ref 8.9–10.3)
CHLORIDE: 108 mmol/L (ref 101–111)
CO2: 28 mmol/L (ref 22–32)
Creatinine, Ser: 0.76 mg/dL (ref 0.44–1.00)
Glucose, Bld: 82 mg/dL (ref 65–99)
Potassium: 3 mmol/L — ABNORMAL LOW (ref 3.5–5.1)
SODIUM: 143 mmol/L (ref 135–145)
Total Bilirubin: 2.2 mg/dL — ABNORMAL HIGH (ref 0.3–1.2)
Total Protein: 7.2 g/dL (ref 6.5–8.1)

## 2016-01-25 LAB — LIPID PANEL
Cholesterol: 220 mg/dL — ABNORMAL HIGH (ref 0–200)
HDL: 66 mg/dL (ref >40)
LDL CALC: 139 mg/dL — AB (ref 0–99)
Total CHOL/HDL Ratio: 3.3 RATIO
Triglycerides: 77 mg/dL (ref <150)
VLDL: 15 mg/dL (ref 0–40)

## 2016-01-25 LAB — FOLATE: FOLATE: 38.3 ng/mL (ref >5.9)

## 2016-01-25 LAB — VITAMIN B12: Vitamin B-12: 589 pg/mL (ref 180–914)

## 2016-01-25 MED ORDER — POTASSIUM CHLORIDE CRYS ER 20 MEQ PO TBCR
20.0000 meq | EXTENDED_RELEASE_TABLET | Freq: Two times a day (BID) | ORAL | Status: AC
  Administered 2016-01-25 – 2016-01-27 (×4): 20 meq via ORAL
  Filled 2016-01-25 (×4): qty 1

## 2016-01-25 NOTE — Progress Notes (Signed)
Patient pleasant on approach and interactive with peers and staff. Asked RN "if I took medications last night and feel better, can I just decline them today"? Encouraged to take her medications as ordered and discuss the long term plan with the psychiatrist. Requested 72 hour discharge form after speaking with psychiatrist. Denies SI/HI and AVH. Speech is pressured but content is appropriate.

## 2016-01-25 NOTE — Progress Notes (Signed)
[facility] MD Progress Note  01/25/2016 12:44 PM Selena [name]  MRN:  [id number] Subjective: Patient states " I feel ok, I know there are no miracles that can happen , but my voices are a bit better , I am trying to cope."   Objective:Selena [name] a 60 y.o.caucasian female, who has no past hx of mental illness, is divorced , unemployed , lives in [place] with her foster son , who presented to [facility] brought in by a friend for psychosis.  Patient seen and chart reviewed.Discussed patient with treatment team.  Pt today seen as more interactive , open , confirms that she does have AH - which are negative and anxiety provoking. However, she feels that the medications are starting to take effect and she feels they are decreasing. Pt reports sleep as improved last night- of note - she had reported that she stays up at night and stay busy since she did not want her voices to control her .  Per staff - pt is visible in milieu, denies any new concerns.      Principal Problem: Bipolar disorder, current episode manic severe with psychotic features [facility]) Diagnosis:   Patient Active Problem List   Diagnosis Date Noted  . Bipolar disorder, current episode manic severe with psychotic features ([place]) [F31.2] 01/24/2016   Total Time spent with patient: 25 minutes  Past Psychiatric History: Please see H&P.   Past Medical History: Please see H&P.  Past Surgical History:  Procedure Laterality Date  . APPENDECTOMY    . cesarian section     Family History:  Family History  Problem Relation Age of Onset  . Breast cancer Mother   . Mental illness Neg Hx    Family Psychiatric  History: pls see above Social History: Please see H&P.  History  Alcohol Use No     History  Drug Use No    Social History   Social History  . Marital status: Legally Separated    Spouse name: N/A  . Number of children: N/A  . Years of education: N/A   Social History Main Topics  . Smoking status: Never Smoker  .  Smokeless tobacco: Never Used  . Alcohol use No  . Drug use: No  . Sexual activity: Not Currently   Other Topics Concern  . None   Social History Narrative  . None   Additional Social History:    Pain Medications: Pt denies Prescriptions: pt denies Over the Counter: Pt denies History of alcohol / drug use?: No history of alcohol / drug abuse Longest period of sobriety (when/how long): NA                    Sleep: improving  Appetite:  Fair  Current Medications: Current Facility-Administered Medications  Medication Dose Route Frequency Provider Last Rate Last Dose  . acetaminophen (TYLENOL) tablet 650 mg  650 mg Oral Q4H PRN Patrecia Pour, NP      . alum & mag hydroxide-simeth (MAALOX/MYLANTA) 200-200-20 MG/5ML suspension 30 mL  30 mL Oral Q4H PRN Patrecia Pour, NP      . hydrOXYzine (ATARAX/VISTARIL) tablet 25 mg  25 mg Oral Q6H PRN Ursula Alert, MD      . ibuprofen (ADVIL,MOTRIN) tablet 600 mg  600 mg Oral Q8H PRN Patrecia Pour, NP      . magnesium hydroxide (MILK OF MAGNESIA) suspension 30 mL  30 mL Oral Daily PRN Patrecia Pour, NP   30 mL at 01/24/16  0010  . OLANZapine (ZYPREXA) tablet 5 mg  5 mg Oral TID PRN Ursula Alert, MD       Or  . OLANZapine (ZYPREXA) injection 5 mg  5 mg Intramuscular TID PRN Ursula Alert, MD      . OLANZapine (ZYPREXA) tablet 2.5 mg  2.5 mg Oral QHS Ursula Alert, MD   2.5 mg at 01/24/16 2104    Lab Results:  Results for orders placed or performed during the hospital encounter of 01/23/16 (from the past 48 hour(s))  Lipid panel     Status: Abnormal   Collection Time: 01/25/16  6:32 AM  Result Value Ref Range   Cholesterol 220 (H) 0 - 200 mg/dL   Triglycerides 77 <150 mg/dL   HDL 66 >40 mg/dL   Total CHOL/HDL Ratio 3.3 RATIO   VLDL 15 0 - 40 mg/dL   LDL Cholesterol 139 (H) 0 - 99 mg/dL    Comment:        Total Cholesterol/HDL:CHD Risk Coronary Heart Disease Risk Table                     Men   Women  1/2 Average Risk    3.4   3.3  Average Risk       5.0   4.4  2 X Average Risk   9.6   7.1  3 X Average Risk  23.4   11.0        Use the calculated Patient Ratio above and the CHD Risk Table to determine the patient's CHD Risk.        ATP III CLASSIFICATION (LDL):  <100     mg/dL   Optimal  100-129  mg/dL   Near or Above                    Optimal  130-159  mg/dL   Borderline  160-189  mg/dL   High  >190     mg/dL   Very High Performed at [facility]   Vitamin B12     Status: None   Collection Time: 01/25/16  6:32 AM  Result Value Ref Range   Vitamin B-12 589 180 - 914 pg/mL    Comment: (NOTE) This assay is not validated for testing neonatal or myeloproliferative syndrome specimens for Vitamin B12 levels. Performed at [facility]   Folate     Status: None   Collection Time: 01/25/16  6:32 AM  Result Value Ref Range   Folate 38.3 >5.9 ng/mL    Comment: Performed at [facility]  Comprehensive metabolic panel     Status: Abnormal   Collection Time: 01/25/16  6:32 AM  Result Value Ref Range   Sodium 143 135 - 145 mmol/L   Potassium 3.0 (L) 3.5 - 5.1 mmol/L   Chloride 108 101 - 111 mmol/L   CO2 28 22 - 32 mmol/L   Glucose, Bld 82 65 - 99 mg/dL   BUN 11 6 - 20 mg/dL   Creatinine, Ser 0.76 0.44 - 1.00 mg/dL   Calcium 9.8 8.9 - 10.3 mg/dL   Total Protein 7.2 6.5 - 8.1 g/dL   Albumin 4.6 3.5 - 5.0 g/dL   AST 33 15 - 41 U/L   ALT 41 14 - 54 U/L   Alkaline Phosphatase 91 38 - 126 U/L   Total Bilirubin 2.2 (H) 0.3 - 1.2 mg/dL   GFR calc non Af Amer >60 >60 mL/min   GFR calc  Af Amer >60 >60 mL/min    Comment: (NOTE) The eGFR has been calculated using the CKD EPI equation. This calculation has not been validated in all clinical situations. eGFR's persistently <60 mL/min signify possible Chronic Kidney Disease.    Anion gap 7 5 - 15    Comment: Performed at [facility]    Blood Alcohol level:  Lab Results  Component Value Date   [facility] <5  54/98/2641    Metabolic Disorder Labs: No results found for: HGBA1C, MPG No results found for: PROLACTIN Lab Results  Component Value Date   CHOL 220 (H) 01/25/2016   TRIG 77 01/25/2016   HDL 66 01/25/2016   CHOLHDL 3.3 01/25/2016   VLDL 15 01/25/2016   LDLCALC 139 (H) 01/25/2016    Physical Findings: AIMS: Facial and Oral Movements Muscles of Facial Expression: None, normal Lips and Perioral Area: None, normal Jaw: None, normal Tongue: None, normal,Extremity Movements Upper (arms, wrists, hands, fingers): None, normal Lower (legs, knees, ankles, toes): None, normal, Trunk Movements Neck, shoulders, hips: None, normal, Overall Severity Severity of abnormal movements (highest score from questions above): None, normal Incapacitation due to abnormal movements: None, normal Patient's awareness of abnormal movements (rate only patient's report): No Awareness, Dental Status Current problems with teeth and/or dentures?: No Does patient usually wear dentures?: No  CIWA:    COWS:     Musculoskeletal: Strength & Muscle Tone: within normal limits Gait & Station: normal Patient leans: N/A  Psychiatric Specialty Exam: Physical Exam  Nursing note and vitals reviewed.   Review of Systems  Psychiatric/Behavioral: Positive for hallucinations. The patient is nervous/anxious.   All other systems reviewed and are negative.   Blood pressure (!) 139/93, pulse 97, temperature 98.5 F (36.9 C), temperature source Oral, resp. rate 16, height 5' 3"  (1.6 m), weight 65.3 kg (144 lb), SpO2 100 %.Body mass index is 25.51 kg/m.  General Appearance: Casual  Eye Contact:  Fair  Speech:  Pressured  Volume:  Decreased  Mood:  Anxious  Affect:  Congruent  Thought Process:  Goal Directed and Descriptions of Associations: Circumstantial  Orientation:  Full (Time, Place, and Person)  Thought Content:  Hallucinations: Auditory and Rumination negative , mind controlling , sexually inappropriate ,  fowl voices that does bad things with her private parts   Suicidal Thoughts:  No  Homicidal Thoughts:  No  Memory:  Immediate;   Fair Recent;   Fair Remote;   Fair  Judgement:  Impaired  Insight:  Fair  Psychomotor Activity:  Normal  Concentration:  Concentration: Fair and Attention Span: Fair  Recall:  [employer] of Knowledge:  Fair  Language:  Fair  Akathisia:  No  Handed:  Right  AIMS (if indicated):     Assets:  Desire for Improvement  ADL's:  Intact  Cognition:  WNL  Sleep:  Number of Hours: 6.5     Treatment Plan Summary:Patient continues to have AH - although improving. Will continue the small dose of zyprexa, she does not want any dosage increase, wants to stay on the same dose.  Will continue today 01/25/16  plan as below except where it is noted.  Daily contact with patient to assess and evaluate symptoms and progress in treatment and Medication management Will continue Zyprexa 2.5 mg po qhs for psychosis. Patient is against taking medications - will consider a mood stabilizer if she is agreeable like Lamictal. Will continue  PRN medications as per agitation protocol. Will continue to monitor vitals ,  medication compliance and treatment side effects while patient is here.  Will monitor for medical issues as well as call consult as needed.  Reviewed labs K+ - low - replaced in ED- repeat today shows - K+ - 3 - will start KDUR replacement .  Reviewed cmp - shows - total bilirubin slightly high -pending  acute hepatitis panel, uds- negative , ua - wnl, pending  RPR , vitamin b12- wnl , folate - wnl . I have reviewed  EKG for qtc - wnl, sinus tachycardia .  CT scan brain - no acute pathology. CSW will continue working on disposition.  Patient to participate in therapeutic milieu .     Annie Saephan, MD 01/25/2016, 12:44 PM

## 2016-01-25 NOTE — Plan of Care (Signed)
Problem: Health Behavior/Discharge Planning: Goal: Compliance with prescribed medication regimen will improve Outcome: Progressing Pt was compliant with scheduled medication tonight.

## 2016-01-25 NOTE — Progress Notes (Signed)
Adult Psychoeducational Group Note  Date:  01/25/2016 Time:  11:02 AM  Group Topic/Focus:  Making Healthy Choices:   The focus of this group is to help patients identify negative/unhealthy choices they were using prior to admission and identify positive/healthier coping strategies to replace them upon discharge.   Participation Level:  Active  Participation Quality:  Appropriate and Attentive  Affect:  Appropriate  Cognitive:  Alert and Appropriate  Insight: Appropriate and Good  Engagement in Group:  Engaged  Modes of Intervention:  Discussion and Education   Mickie Baillizabeth O Iwenekha 01/25/2016, 11:02 AM

## 2016-01-25 NOTE — [facility] Group Notes (Signed)
[facility] Group Notes: (Clinical Social Work)   01/25/2016      Type of Therapy:  Group Therapy   Participation Level:  Did Not Attend despite MHT prompting   Leida Luton Grossman-Orr, LCSW 01/25/2016, 12:53 PM     

## 2016-01-25 NOTE — Progress Notes (Signed)
Pt rated her day a 7 out of 10. Pt mentioned an ordinary day for her is a 7. Pt did not want to set goals she just wants to live life.

## 2016-01-26 LAB — HEMOGLOBIN A1C
Hgb A1c MFr Bld: 5.2 % (ref 4.8–5.6)
Mean Plasma Glucose: 103 mg/dL

## 2016-01-26 MED ORDER — TRAZODONE HCL 50 MG PO TABS
25.0000 mg | ORAL_TABLET | Freq: Every evening | ORAL | Status: DC | PRN
  Filled 2016-01-26: qty 4

## 2016-01-26 NOTE — [facility] Group Notes (Signed)
Patient did not attend nurse psychoeducational group.  

## 2016-01-26 NOTE — Progress Notes (Signed)
D: Pt was in the day room with visitors upon initial approach.  Pt presents with labile affect and mood.  Pt reports her day was "fine."  She states "we talked about what we feel good about ourself."  Pt denies having a goal for the day.  She reports she is discharging Tuesday and she feels safe to do so.  Pt denies SI/HI, denies hallucinations, denies pain.  Pt has been visible in milieu and attended evening group.   A: Introduced self to pt.  Actively listened to pt and offered support and encouragement. Medications administered per order.  PRN medication administered for constipation. R: Pt is safe on the unit.  Pt is compliant with medication.  Pt verbally contracts for safety.  Will continue to monitor and assess.

## 2016-01-26 NOTE — [facility] Group Notes (Signed)
[facility] Group Notes:  (Clinical Social Work)  01/26/2016  11:00AM-12:00PM  Summary of Progress/Problems:  The main focus of today's process group was to listen to a variety of genres of music and to identify that different types of music provoke different responses.  The patient then was able to identify personally what was soothing for them, as well as energizing, as well as how patient can personally use this knowledge in sleep habits, with depression, and with other symptoms.  The patient expressed at the beginning of group the overall feeling of "relaxed" and she participated verbally throughout group.  She talked about she uses music at home for relaxation and diversion from stress.  She danced and sang along quite a bit, gave feedback and was very involved in group today.  Type of Therapy:  Music Therapy   Participation Level:  Active  Participation Quality:  Attentive and Sharing  Affect:  Blunted  Cognitive:  Oriented  Insight:  Engaged  Engagement in Therapy:  Engaged  Modes of Intervention:   Activity, Exploration  Ambrose MantleMareida Grossman-Orr, LCSW 01/26/2016

## 2016-01-26 NOTE — Progress Notes (Signed)
D: Patient's self inventory sheet: patient has poor sleep, did not receive or request sleep medication.good  Appetite, normal energy level, good concentration. Rated depression 0/10, hopeless 0/10, anxiety 0/10. SI/HI/AVH: denies all. Physical complaints are denied. Goal is "just love". Plans to work on "do it". Patient said that she is eager to return home, she is worried about her son who relies on her for transportation. Said that she wants to get back to her life.    A: Medications administered, assessed medication knowledge and education given on medication regimen.  Emotional support and encouragement given patient. R: Denies SI and HI , contracts for safety. Safety maintained with 15 minute checks.

## 2016-01-26 NOTE — Progress Notes (Addendum)
[facility] MD Progress Note  01/26/2016 11:38 AM Selena [name]  MRN:  [id number] Subjective: Patient states " I feel tired , I did not sleep, its the 15 minutes check , I can sleep better at home, I will just take the sleep aid from [employer] if I need anything for sleep. I do not want any more medications.'   Objective:Latina Selena [name] a 60 y.o.caucasian female, who has no past hx of mental illness, is divorced , unemployed , lives in [place] with her foster son , who presented to [facility] brought in by a friend for psychosis.  Patient seen and chart reviewed.Discussed patient with treatment team.  Pt today seen as still pressured , however appears lethargic - states she had a restless night. Discussed medication option as well as using ear plugs , eye cover to limit distraction at night . Pt also continues to endorse AH - has insight that it will not go away all at once - however continues to not trust medications. Provided education about medications as well as her diagnosis . Per staff - pt is compliant on medications, is visible in milieu.      Principal Problem: Bipolar disorder, current episode manic severe with psychotic features [facility]) Diagnosis:   Patient Active Problem List   Diagnosis Date Noted  . Bipolar disorder, current episode manic severe with psychotic features ([place]) [F31.2] 01/24/2016   Total Time spent with patient: 25 minutes  Past Psychiatric History: Please see H&P.   Past Medical History: Please see H&P.  Past Surgical History:  Procedure Laterality Date  . APPENDECTOMY    . cesarian section     Family History:  Family History  Problem Relation Age of Onset  . Breast cancer Mother   . Mental illness Neg Hx    Family Psychiatric  History: pls see above Social History: Please see H&P.  History  Alcohol Use No     History  Drug Use No    Social History   Social History  . Marital status: Legally Separated    Spouse name: N/A  . Number of children: N/A   . Years of education: N/A   Social History Main Topics  . Smoking status: Never Smoker  . Smokeless tobacco: Never Used  . Alcohol use No  . Drug use: No  . Sexual activity: Not Currently   Other Topics Concern  . None   Social History Narrative  . None   Additional Social History:    Pain Medications: Pt denies Prescriptions: pt denies Over the Counter: Pt denies History of alcohol / drug use?: No history of alcohol / drug abuse Longest period of sobriety (when/how long): NA                    Sleep: Poor  Appetite:  Fair  Current Medications: Current Facility-Administered Medications  Medication Dose Route Frequency Provider Last Rate Last Dose  . acetaminophen (TYLENOL) tablet 650 mg  650 mg Oral Q4H PRN Patrecia Pour, NP      . alum & mag hydroxide-simeth (MAALOX/MYLANTA) 200-200-20 MG/5ML suspension 30 mL  30 mL Oral Q4H PRN Patrecia Pour, NP      . hydrOXYzine (ATARAX/VISTARIL) tablet 25 mg  25 mg Oral Q6H PRN Ursula Alert, MD      . ibuprofen (ADVIL,MOTRIN) tablet 600 mg  600 mg Oral Q8H PRN Patrecia Pour, NP      . magnesium hydroxide (MILK OF MAGNESIA) suspension 30 mL  30  mL Oral Daily PRN Patrecia Pour, NP   30 mL at 01/25/16 2049  . OLANZapine (ZYPREXA) tablet 5 mg  5 mg Oral TID PRN Ursula Alert, MD       Or  . OLANZapine (ZYPREXA) injection 5 mg  5 mg Intramuscular TID PRN Ursula Alert, MD      . OLANZapine (ZYPREXA) tablet 2.5 mg  2.5 mg Oral QHS Ursula Alert, MD   2.5 mg at 01/25/16 2113  . potassium chloride SA (K-DUR,KLOR-CON) CR tablet 20 mEq  20 mEq Oral BID Ursula Alert, MD   20 mEq at 01/26/16 6712    Lab Results:  Results for orders placed or performed during the hospital encounter of 01/23/16 (from the past 48 hour(s))  Lipid panel     Status: Abnormal   Collection Time: 01/25/16  6:32 AM  Result Value Ref Range   Cholesterol 220 (H) 0 - 200 mg/dL   Triglycerides 77 <150 mg/dL   HDL 66 >40 mg/dL   Total CHOL/HDL Ratio  3.3 RATIO   VLDL 15 0 - 40 mg/dL   LDL Cholesterol 139 (H) 0 - 99 mg/dL    Comment:        Total Cholesterol/HDL:CHD Risk Coronary Heart Disease Risk Table                     Men   Women  1/2 Average Risk   3.4   3.3  Average Risk       5.0   4.4  2 X Average Risk   9.6   7.1  3 X Average Risk  23.4   11.0        Use the calculated Patient Ratio above and the CHD Risk Table to determine the patient's CHD Risk.        ATP III CLASSIFICATION (LDL):  <100     mg/dL   Optimal  100-129  mg/dL   Near or Above                    Optimal  130-159  mg/dL   Borderline  160-189  mg/dL   High  >190     mg/dL   Very High Performed at [name]   RPR     Status: None   Collection Time: 01/25/16  6:32 AM  Result Value Ref Range   RPR Ser Ql Non Reactive Non Reactive    Comment: (NOTE) Performed At: [name] [place], [place] [id number] Lindon Romp MD AS:[id number] Performed at [facility]   Vitamin B12     Status: None   Collection Time: 01/25/16  6:32 AM  Result Value Ref Range   Vitamin B-12 589 180 - 914 pg/mL    Comment: (NOTE) This assay is not validated for testing neonatal or myeloproliferative syndrome specimens for Vitamin B12 levels. Performed at [name]   Folate     Status: None   Collection Time: 01/25/16  6:32 AM  Result Value Ref Range   Folate 38.3 >5.9 ng/mL    Comment: Performed at [name]  Comprehensive metabolic panel     Status: Abnormal   Collection Time: 01/25/16  6:32 AM  Result Value Ref Range   Sodium 143 135 - 145 mmol/L   Potassium 3.0 (L) 3.5 - 5.1 mmol/L   Chloride 108 101 - 111 mmol/L   CO2 28 22 - 32 mmol/L   Glucose,  Bld 82 65 - 99 mg/dL   BUN 11 6 - 20 mg/dL   Creatinine, Ser 0.76 0.44 - 1.00 mg/dL   Calcium 9.8 8.9 - 10.3 mg/dL   Total Protein 7.2 6.5 - 8.1 g/dL   Albumin 4.6 3.5 - 5.0 g/dL   AST 33 15 - 41 U/L   ALT 41 14 - 54 U/L   Alkaline  Phosphatase 91 38 - 126 U/L   Total Bilirubin 2.2 (H) 0.3 - 1.2 mg/dL   GFR calc non Af Amer >60 >60 mL/min   GFR calc Af Amer >60 >60 mL/min    Comment: (NOTE) The eGFR has been calculated using the CKD EPI equation. This calculation has not been validated in all clinical situations. eGFR's persistently <60 mL/min signify possible Chronic Kidney Disease.    Anion gap 7 5 - 15    Comment: Performed at [facility]    Blood Alcohol level:  Lab Results  Component Value Date   [facility] <5 76/28/3151    Metabolic Disorder Labs: No results found for: HGBA1C, MPG No results found for: PROLACTIN Lab Results  Component Value Date   CHOL 220 (H) 01/25/2016   TRIG 77 01/25/2016   HDL 66 01/25/2016   CHOLHDL 3.3 01/25/2016   VLDL 15 01/25/2016   LDLCALC 139 (H) 01/25/2016    Physical Findings: AIMS: Facial and Oral Movements Muscles of Facial Expression: None, normal Lips and Perioral Area: None, normal Jaw: None, normal Tongue: None, normal,Extremity Movements Upper (arms, wrists, hands, fingers): None, normal Lower (legs, knees, ankles, toes): None, normal, Trunk Movements Neck, shoulders, hips: None, normal, Overall Severity Severity of abnormal movements (highest score from questions above): None, normal Incapacitation due to abnormal movements: None, normal Patient's awareness of abnormal movements (rate only patient's report): No Awareness, Dental Status Current problems with teeth and/or dentures?: No Does patient usually wear dentures?: No  CIWA:    COWS:     Musculoskeletal: Strength & Muscle Tone: within normal limits Gait & Station: normal Patient leans: N/A  Psychiatric Specialty Exam: Physical Exam  Nursing note and vitals reviewed.   Review of Systems  Psychiatric/Behavioral: Positive for hallucinations. The patient is nervous/anxious and has insomnia.   All other systems reviewed and are negative.   Blood pressure 132/78, pulse (!) 105,  temperature 98 F (36.7 C), temperature source Oral, resp. rate 17, height 5' 3"  (1.6 m), weight 65.3 kg (144 lb), SpO2 100 %.Body mass index is 25.51 kg/m.  General Appearance: Casual  Eye Contact:  Fair  Speech:  Pressured  Volume:  Decreased  Mood:  Anxious and tired  Affect:  Appropriate  Thought Process:  Goal Directed and Descriptions of Associations: Circumstantial  Orientation:  Full (Time, Place, and Person)  Thought Content:  Hallucinations: Auditory and Rumination negative , mind controlling , sexually inappropriate , fowl voices that does bad things with her private parts - continues to have AH - states she knows how to deal with it   Suicidal Thoughts:  No  Homicidal Thoughts:  No  Memory:  Immediate;   Fair Recent;   Fair Remote;   Fair  Judgement:  Impaired  Insight:  Fair  Psychomotor Activity:  Normal  Concentration:  Concentration: Fair and Attention Span: Fair  Recall:  [employer] of Knowledge:  Fair  Language:  Fair  Akathisia:  No  Handed:  Right  AIMS (if indicated):     Assets:  Desire for Improvement  ADL's:  Intact  Cognition:  WNL  Sleep:  Number of Hours: 6     Treatment Plan Summary:Patient continues to have AH -states she knows how to deal with it - is evasive when asked about AH - does not comment whether they improved . Had sleep issues last night - declines medications for the same and wants to stay on the same dose of zyprexa.    Will continue today 11/5 17  plan as below except where it is noted.  Daily contact with patient to assess and evaluate symptoms and progress in treatment and Medication management Will continue Zyprexa 2.5 mg po qhs for psychosis. Patient is against taking medications - will consider a mood stabilizer if she is agreeable like Lamictal. Will continue  PRN medications as per agitation protocol. Provided medication education - offered sleep medications - she declined - pt to make use of ear plugs and eye cover for  sleep issues tonight and make use of PRN medications if she wants for sleep issues. Will continue to monitor vitals ,medication compliance and treatment side effects while patient is here.  Will monitor for medical issues as well as call consult as needed.  Reviewed labs K+ - low - replaced in ED- repeat today shows - K+ - 3 - will continue  KDUR replacement .  I have Reviewed cmp - shows - total bilirubin slightly high -pending  acute hepatitis panel, uds- negative , ua - wnl,  RPR- wnl  , vitamin b12- wnl , folate - wnl . I have reviewed  EKG for qtc - wnl, sinus tachycardia .  CT scan brain - no acute pathology. CSW will continue working on disposition.  Patient to participate in therapeutic milieu .     Selena Tison, MD 01/26/2016, 11:38 AM

## 2016-01-26 NOTE — Plan of Care (Signed)
Problem: Activity: Goal: Sleeping patterns will improve Outcome: Progressing Pt slept 6.5 hours last night according to flowsheet.    

## 2016-01-27 DIAGNOSIS — F312 Bipolar disorder, current episode manic severe with psychotic features: Principal | ICD-10-CM

## 2016-01-27 DIAGNOSIS — Z79899 Other long term (current) drug therapy: Secondary | ICD-10-CM

## 2016-01-27 DIAGNOSIS — Z803 Family history of malignant neoplasm of breast: Secondary | ICD-10-CM

## 2016-01-27 LAB — BASIC METABOLIC PANEL
ANION GAP: 9 (ref 5–15)
BUN: 18 mg/dL (ref 6–20)
CO2: 29 mmol/L (ref 22–32)
Calcium: 9.6 mg/dL (ref 8.9–10.3)
Chloride: 104 mmol/L (ref 101–111)
Creatinine, Ser: 0.99 mg/dL (ref 0.44–1.00)
GLUCOSE: 96 mg/dL (ref 65–99)
POTASSIUM: 3.7 mmol/L (ref 3.5–5.1)
Sodium: 142 mmol/L (ref 135–145)

## 2016-01-27 NOTE — Progress Notes (Signed)
Adult Psychoeducational Group Note  Date:  01/27/2016 Time:  9:00 PM  Group Topic/Focus:  Wrap-Up Group:   The focus of this group is to help patients review their daily goal of treatment and discuss progress on daily workbooks.   Participation Level:  Active  Participation Quality:  Appropriate  Affect:  Appropriate  Cognitive:  Appropriate  Insight: Appropriate  Engagement in Group:  Engaged  Modes of Intervention:  Discussion  Additional Comments:  The patient expressed that she attended groups and rates today a 7.The patient also said that she had discomfort in her back. Octavio Mannshigpen, Arieon Scalzo Lee 01/27/2016, 9:00 PM

## 2016-01-27 NOTE — Progress Notes (Signed)
D: Patient denies SI/HI and A/V hallucinations; patient reports sleep is fair; reports appetite is good; reports energy level is normal ; reports ability to concentrate is good; rates depression as 0/10; rates hopelessness 0/10; rates anxiety as 0/10;   A: Monitored q 15 minutes; patient encouraged to attend groups; patient educated about medications; patient given medications per physician orders; patient encouraged to express feelings and/or concerns  R: Patient reports back pain after resting in the beds; patient is appropriate; patient is cooperative and pleasant; patient's interaction with staff and peers is appropriate; patient was able to set goal to talk with staff 1:1 when having feelings of SI; patient is taking medications as prescribed and tolerating medications; patient is attending all groups

## 2016-01-27 NOTE — Progress Notes (Signed)
[facility]  01/27/2016 3:21 PM Selena [name]  MRN:  [id number]   Subjective: Patient states " I am okay, but my knees are hurting." patient was offered Ibprofen/tylenol patient report the medication is not safe.  Objective: Selena [name] is awake, alert and oriented *3. Denies suicidal or homicidal ideation. Denies auditory or visual hallucination and does not appear to be responding to internal stimuli. Patient appears to be paranoid with the medication and treatment. Patient reports she take her "medication only when its safe to take"  Pt reports a good appetite and states she is resting well throughout the night. Support, encouragement and reassurance was provided.   Principal Problem: Bipolar disorder, current episode manic severe with psychotic features [facility]) Diagnosis:   Patient Active Problem List   Diagnosis Date Noted  . Bipolar disorder, current episode manic severe with psychotic features ([facility]) [F31.2] 01/24/2016   Total Time spent with patient: 25 minutes  Past Psychiatric History: Please see H&P.   Past Medical History: Please see H&P.  Past Surgical History:  Procedure Laterality Date  . APPENDECTOMY    . cesarian section     Family History:  Family History  Problem Relation Age of Onset  . Breast cancer Mother   . Mental illness Neg Hx    Family Psychiatric  History: pls see above Social History: Please see H&P.  History  Alcohol Use No     History  Drug Use No    Social History   Social History  . Marital status: Legally Separated    Spouse name: N/A  . Number of children: N/A  . Years of education: N/A   Social History Main Topics  . Smoking status: Never Smoker  . Smokeless tobacco: Never Used  . Alcohol use No  . Drug use: No  . Sexual activity: Not Currently   Other Topics Concern  . None   Social History Narrative  . None   Additional Social History:    Pain Medications: Pt denies Prescriptions: pt denies Over the Counter:  Pt denies History of alcohol / drug use?: No history of alcohol / drug abuse Longest period of sobriety (when/how long): NA                    Sleep: Poor  Appetite:  Fair  Current Medications: Current Facility-Administered Medications  Medication Dose Route Frequency Provider Last Rate Last Dose  . acetaminophen (TYLENOL) tablet 650 mg  650 mg Oral Q4H PRN Charm RingsJamison Y Lord, NP   650 mg at 01/27/16 1111  . alum & mag hydroxide-simeth (MAALOX/MYLANTA) 200-200-20 MG/5ML suspension 30 mL  30 mL Oral Q4H PRN Charm RingsJamison Y Lord, NP      . hydrOXYzine (ATARAX/VISTARIL) tablet 25 mg  25 mg Oral Q6H PRN Jomarie LongsSaramma Eappen, MD      . ibuprofen (ADVIL,MOTRIN) tablet 600 mg  600 mg Oral Q8H PRN Charm RingsJamison Y Lord, NP      . magnesium hydroxide (MILK OF MAGNESIA) suspension 30 mL  30 mL Oral Daily PRN Charm RingsJamison Y Lord, NP   30 mL at 01/25/16 2049  . OLANZapine (ZYPREXA) tablet 5 mg  5 mg Oral TID PRN Jomarie LongsSaramma Eappen, MD       Or  . OLANZapine (ZYPREXA) injection 5 mg  5 mg Intramuscular TID PRN Jomarie LongsSaramma Eappen, MD      . OLANZapine (ZYPREXA) tablet 2.5 mg  2.5 mg Oral QHS Jomarie LongsSaramma Eappen, MD   2.5 mg at 01/26/16 2138  .  traZODone (DESYREL) tablet 25 mg  25 mg Oral QHS PRN Jomarie Longs, MD        Lab Results:  No results found for this or any previous visit (from the past 48 hour(s)).  Blood Alcohol level:  Lab Results  Component Value Date   ETH <5 01/23/2016    Metabolic Disorder Labs: Lab Results  Component Value Date   HGBA1C 5.2 01/25/2016   MPG 103 01/25/2016   Lab Results  Component Value Date   PROLACTIN 65.4 (H) 01/25/2016   Lab Results  Component Value Date   CHOL 220 (H) 01/25/2016   TRIG 77 01/25/2016   HDL 66 01/25/2016   CHOLHDL 3.3 01/25/2016   VLDL 15 01/25/2016   LDLCALC 139 (H) 01/25/2016    Physical Findings: AIMS: Facial and Oral Movements Muscles of Facial Expression: None, normal Lips and Perioral Area: None, normal Jaw: None, normal Tongue: None,  normal,Extremity Movements Upper (arms, wrists, hands, fingers): None, normal Lower (legs, knees, ankles, toes): None, normal, Trunk Movements Neck, shoulders, hips: None, normal, Overall Severity Severity of abnormal movements (highest score from questions above): None, normal Incapacitation due to abnormal movements: None, normal Patient's awareness of abnormal movements (rate only patient's report): No Awareness, Dental Status Current problems with teeth and/or dentures?: No Does patient usually wear dentures?: No  CIWA:    COWS:     Musculoskeletal: Strength & Muscle Tone: within normal limits Gait & Station: normal Patient leans: N/A  Psychiatric Specialty Exam: Physical Exam  Nursing note and vitals reviewed. Constitutional: She is oriented to person, place, and time. She appears well-developed.  HENT:  Head: Normocephalic.  Musculoskeletal: Normal range of motion.  Neurological: She is alert and oriented to person, place, and time.  Psychiatric: She has a normal mood and affect. Her behavior is normal.    Review of Systems  Psychiatric/Behavioral: Negative for depression and hallucinations. The patient is nervous/anxious and has insomnia.   All other systems reviewed and are negative.   Blood pressure 140/89, pulse 70, temperature 98.2 F (36.8 C), resp. rate 18, height 5\' 3"  (1.6 m), weight 65.3 kg (144 lb), SpO2 100 %.Body mass index is 25.51 kg/m.  General Appearance: Casual  Eye Contact:  Fair  Speech:  Pressured  Volume:  Decreased  Mood:  Anxious and tired  Affect:  Appropriate  Thought Process:  Goal Directed and Descriptions of Associations: Circumstantial  Orientation:  Full (Time, Place, and Person)  Thought Content:  Hallucinations: Auditory and Rumination   Suicidal Thoughts:  No  Homicidal Thoughts:  No  Memory:  Immediate;   Fair Recent;   Fair Remote;   Fair  Judgement:  Impaired  Insight:  Fair  Psychomotor Activity:  Normal  Concentration:   Concentration: Fair and Attention Span: Fair  Recall:  [employer] of Knowledge:  Fair  Language:  Fair  Akathisia:  No  Handed:  Right  AIMS (if indicated):     Assets:  Desire for Improvement  ADL's:  Intact  Cognition:  WNL  Sleep:  Number of Hours: 6     I agree with current treatment plan on 01/27/2016, Patient seen face-to-face for psychiatric evaluation follow-up, chart reviewed. Reviewed the information documented and agree with the treatment plan.  Treatment Plan Summary: Daily contact with patient to assess and evaluate symptoms and progress in treatment and Medication management   Will continue Zyprexa 2.5 mg po qhs for psychosis. Patient is against taking medications - will consider a mood stabilizer  if she is agreeable like Lamictal. Will continue  PRN medications as per agitation protocol. Provided medication education - offered sleep medications - she declined - pt to make use of ear plugs and eye cover for sleep issues tonight and make use of PRN medications if she wants for sleep issues. Will continue to monitor vitals ,medication compliance and treatment side effects while patient is here.  Will monitor for medical issues as well as call consult as needed.  Reviewed labs K+ - low - replaced in ED- repeat today shows - K+ - 3 - will continue  KDUR replacement .  I have Reviewed cmp - shows - total bilirubin slightly high -pending  acute hepatitis panel, uds- negative , ua - wnl,  RPR- wnl  , vitamin b12- wnl , folate - wnl . I have reviewed  EKG for qtc - wnl, sinus tachycardia .  CT scan brain - no acute pathology. CSW will continue working on disposition.  Patient to participate in therapeutic milieu .     Oneta Rackanika N Lewis, NP 01/27/2016, 3:21 PM   Agree with NP Progress Note as above

## 2016-01-27 NOTE — Progress Notes (Signed)
Adult Psychoeducational Group Note  Date:  01/27/2016 Time:  12:10 AM  Group Topic/Focus:  Wrap-Up Group:   The focus of this group is to help patients review their daily goal of treatment and discuss progress on daily workbooks.   Participation Level:  Active  Participation Quality:  Appropriate and Sharing  Affect:  Appropriate  Cognitive:  Alert and Appropriate  Insight: Limited  Engagement in Group:  Engaged and Supportive  Modes of Intervention:  Discussion  Additional Comments: Pt stated that her goal is to "go home". Thinks she may be leaving on Tuesday. Stated, "I miss my family and they miss me".  Fransico MichaelBrooks, Kamelia Lampkins Laverne 01/27/2016, 12:10 AM

## 2016-01-27 NOTE — [facility] Group Notes (Signed)
[facility] LCSW Group Therapy  01/27/2016 1:15 pm  Type of Therapy: Process Group Therapy  Participation Level:  Active  Participation Quality:  Appropriate  Affect:  Flat  Cognitive:  Oriented  Insight:  Improving  Engagement in Group:  Limited  Engagement in Therapy:  Limited  Modes of Intervention:  Activity, Clarification, Education, Problem-solving and Support  Summary of Progress/Problems: Today's group addressed the issue of overcoming obstacles.  Patients were asked to identify their biggest obstacle post d/c that stands in the way of their on-going success, and then problem solve as to how to manage this. Stayed the entire time, engaged throughout.  Initially c/o feeling tired and uninspired.  However, she rallied and kicked things off by declaring that life is too complicated, and wondering who knows why that is.  Willing to listen to feedback and ideas of others, and by the end was feeling upbeat and opitimistic.  Daryel Geraldorth, Arien Benincasa B 01/27/2016   3:18 PM

## 2016-01-27 NOTE — Progress Notes (Signed)
Recreation Therapy Notes  INPATIENT RECREATION THERAPY ASSESSMENT  Patient Details Name: Rollen SoxJoyce Eblin MRN: [id number] DOB: 09/25/1955 Today's Date: 01/27/2016  Patient Stressors:  (Pt could not name any stressors.)  Pt stated she didn't know why she was here and she was not stressed and fighting a force more powerful than her.  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

## 2016-01-27 NOTE — Progress Notes (Signed)
Recreation Therapy Notes  Date: 01/27/16 Time: 1000 Location: 500 Hall Dayroom       Group Topic: Communication, Team Building, Problem Solving  Goal Area(s) Addresses:  Patient will effectively work with peer towards shared goal.  Patient will identify skills used to make activity successful.  Patient will identify how skills used during activity can be used to reach post d/c goals.   Behavioral Response: Observed  Intervention: STEM Activity  Activity: Landing Pad. In teams patients were given 12 plastic drinking straws and a length of masking tape. Using the materials provided patients were asked to build a landing pad to catch a golf ball dropped from approximately 6 feet in the air.   Education: Pharmacist, communityocial Skills, Discharge Planning   Education Outcome: Acknowledges education/In group clarification offered/Needs additional education.   Clinical Observations/Feedback:  Pt arrived late, observed her peers.    Caroll RancherMarjette Gavynn Duvall, LRT/CTRS     Caroll RancherLindsay, Raylynn Hersh A 01/27/2016 11:42 AM

## 2016-01-27 NOTE — Progress Notes (Signed)
Patient ID: Selena [name], female   DOB: 01/28/1956, 60 y.o.   MRN: [id number]  D: Patient sitting in dayroom watching TV and interacting well with peers. Pt reports she always have voices talking back to her. Pt observed taking to self in room. Pt stated she "is not a medicine person and will not continue taking it after discharge". Denies SI/HI and pain.No behavioral issues noted.  A: Support and encouragement offered as needed. Medications administered as prescribed.  R: Patient safe and cooperative on unit. Will continue to monitor patient for safety and stability.

## 2016-01-27 NOTE — Progress Notes (Signed)
   D: Pt was pleasant and appropriate on the unit. Interacting appropriately with her peers. Pt complained of lack of sleep. Informed the writer that she's "supposed to be going home tomorrow".  Stated, "that's what I'm looking forward to". Pt stated she's going to be careful before signing her paperwork. Stated, "that's what happened to me the last time". Explained to the writer how she was brought into the hospital against her will and wants to make sure the same thing doesn't happen again. Pt asked if signing means that she "has to go for more treatment or follow up". Writer explained that her signing means that she understands what's expected and the discharge instructions.  Pt has no other questions or concerns.   A:  Support and encouragement was offered. 15 min checks continued for safety.  R: Pt remains safe.

## 2016-01-27 NOTE — [facility] Counselor (Signed)
Pt would not sign ROI during PSA.  Today, in anticipation of Tuesday d/c, I asked pt to sign ROI for [facility].  She asked if this meant she had to go, but even after being reassured there was no obligation, she ultimately declined to sign.  Unable to determine if it is paranoia or concern about losing control.

## 2016-01-27 NOTE — Plan of Care (Signed)
Problem: Education: Goal: Knowledge of the prescribed therapeutic regimen will improve Outcome: Not Progressing Pt reports she takes "baby dose medicine" because her body does not agree with medications. Pt stated she will not continue current medication regime

## 2016-01-28 MED ORDER — HYDROXYZINE HCL 25 MG PO TABS: 25.0000 mg | ORAL_TABLET | Freq: Four times a day (QID) | ORAL | 0 refills | Status: AC | PRN

## 2016-01-28 MED ORDER — TRAZODONE HCL 50 MG PO TABS: 25.0000 mg | ORAL_TABLET | Freq: Every evening | ORAL | 0 refills | Status: AC | PRN

## 2016-01-28 MED ORDER — OLANZAPINE 2.5 MG PO TABS: 2.5000 mg | ORAL_TABLET | Freq: Every day | ORAL | 0 refills | Status: AC

## 2016-01-28 NOTE — Discharge Summary (Signed)
Physician Discharge Summary Note  Patient:  Selena [name] is an 60 y.o., female MRN:  [id number] DOB:  02/26/1956 Patient phone:  [phone] (home)  Patient address:   [address] [place] [address],  Total Time spent with patient: 45 minutes  Date of Admission:  01/23/2016 Date of Discharge: 01/28/16  Reason for Admission:   Selena [name] a 60 y.o.caucasian female, who has no past hx of mental illness, is divorced , unemployed , lives in [place] with her foster son , who presented to [facility] brought in by a friend for psychosis.   Per initial notes in EHR : "Pt reports auditory hallucinations. Per Pt she hears a voice "talking foul and filthy." Pt states the voice is negative and tries to lower her self-esteem. Pt states the voice will not allow her to sleep. Pt states that she is hearing the voice because "the world is evil." Pt attempted to file a police report because she feels that someone is trying to have mind control over her. Pt denies previous mental health history. Pt denies mental health medication. Pt states informed her family about the voice and they became very concerned about her. Pt denies SA and abuse. Pt's family state the Pt has been talking back to the voice and wandering off. "   Patient seen and chart reviewed today. Discussed patient with treatment team. Pt today seen as anxious , pressured , however is very delusional . Pt goes ona nd on about her voices - states that it started several months ago. Pt reports that these voices are "filthy" talks down on her. Pt reports that the voices are always controlling , knows all about her , all about her childhood and she feels they are trying to control her mind . Pt reports that these voices does things to her private parts and they are usually sexual in nature. Pt reports she has been fighting back , trying to cope , and giving control to God who knows who she is . Pt reports that these voices will control her if she goes in  to deep sleep , hence she does not sleep and stays busy . Pt reports she does not know how her energy is , but she does not feel the need to sleep even when she does not sleep at night. Pt reports she copes with the voices by " staying busy " all day and night . Pt is hyper religious - talks at length about god and higher power. Pt denies any mood sx - however appears manic versus hypomanic . Pt also reports VH - of images , pt also reports paranoid ideation - states " this is all a set up .' Pt reports she does not take any drugs or illicit substances - and is against taking medications. Pt denies past hx of depression /anxiety/ psychosis and IP admissions. Pt denies any SI or past attempts. Pt denies hx of sexual or physical abuse.  Principal Problem: Bipolar disorder, current episode manic severe with psychotic features [facility]) Discharge Diagnoses: Patient Active Problem List   Diagnosis Date Noted  . Bipolar disorder, current episode manic severe with psychotic features (HCC) [F31.2] 01/24/2016    Past Psychiatric History: See H&P  Past Medical History: History reviewed. No pertinent past medical history.  Past Surgical History:  Procedure Laterality Date  . APPENDECTOMY    . cesarian section     Family History:  Family History  Problem Relation Age of Onset  .  Breast cancer Mother   . Mental illness Neg Hx    Family Psychiatric  History: See H&P Social History:  History  Alcohol Use No     History  Drug Use No    Social History   Social History  . Marital status: Legally Separated    Spouse name: N/A  . Number of children: N/A  . Years of education: N/A   Social History Main Topics  . Smoking status: Never Smoker  . Smokeless tobacco: Never Used  . Alcohol use No  . Drug use: No  . Sexual activity: Not Currently   Other Topics Concern  . None   Social History Narrative  . None    Hospital Course:   Selena [name] was admitted for Bipolar disorder, current  episode manic severe with psychotic features (HCC), and crisis management.  Pt was treated discharged with the medications listed below under Medication List.  Medical problems were identified and treated as needed.  Home medications were restarted as appropriate.  Improvement was monitored by observation and Selena SoxJoyce Menge 's daily report of symptom reduction.  Emotional and mental status was monitored by daily self-inventory reports completed by Selena SoxJoyce Fruchter and clinical staff.         Selena [name] was evaluated by the treatment team for stability and plans for continued recovery upon discharge. Selena [name] 's motivation was an integral factor for scheduling further treatment. Employment, transportation, bed availability, health status, family support, and any pending legal issues were also considered during hospital stay. Pt was offered further treatment options upon discharge including but not limited to Residential, Intensive Outpatient, and Outpatient treatment.  Selena SoxJoyce Uber will follow up with the services as listed below under Follow Up Information.     Upon completion of this admission the patient was both mentally and medically stable for discharge denying suicidal/homicidal ideation, auditory/visual/tactile hallucinations, delusional thoughts and paranoia.    Selena SoxJoyce Dost responded well to treatment with zyprexa, vistaril, trazodone without adverse effects. Pt demonstrated improvement without reported or observed adverse effects to the point of stability appropriate for outpatient management. Pertinent labs include: Cholesterol 220, LDL 139, Prolactin 65.4 for which outpatient follow-up is necessary for lab recheck as mentioned below. Reviewed CBC, CMP, BAL, and UDS; all unremarkable aside from noted exceptions.   Physical Findings: AIMS: Facial and Oral Movements Muscles of Facial Expression: None, normal Lips and Perioral Area: None, normal Jaw: None, normal Tongue: None, normal,Extremity  Movements Upper (arms, wrists, hands, fingers): None, normal Lower (legs, knees, ankles, toes): None, normal, Trunk Movements Neck, shoulders, hips: None, normal, Overall Severity Severity of abnormal movements (highest score from questions above): None, normal Incapacitation due to abnormal movements: None, normal Patient's awareness of abnormal movements (rate only patient's report): No Awareness, Dental Status Current problems with teeth and/or dentures?: No Does patient usually wear dentures?: No  CIWA:    COWS:     Musculoskeletal: Strength & Muscle Tone: within normal limits Gait & Station: normal Patient leans: N/A  Psychiatric Specialty Exam: Physical Exam  Review of Systems  Psychiatric/Behavioral: Positive for depression. Negative for hallucinations and suicidal ideas. The patient is nervous/anxious and has insomnia.   All other systems reviewed and are negative.   Blood pressure (!) 181/99, pulse 90, temperature 98.4 F (36.9 C), temperature source Oral, resp. rate 16, height 5\' 3"  (1.6 m), weight 65.3 kg (144 lb), SpO2 100 %.Body mass index is 25.51 kg/m.  SEE MD PSE WITHIN SRA  Have you used any form  of tobacco in the last 30 days? (Cigarettes, Smokeless Tobacco, Cigars, and/or Pipes): No  Has this patient used any form of tobacco in the last 30 days? (Cigarettes, Smokeless Tobacco, Cigars, and/or Pipes) No  Blood Alcohol level:  Lab Results  Component Value Date   ETH <5 01/23/2016    Metabolic Disorder Labs:  Lab Results  Component Value Date   HGBA1C 5.2 01/25/2016   MPG 103 01/25/2016   Lab Results  Component Value Date   PROLACTIN 65.4 (H) 01/25/2016   Lab Results  Component Value Date   CHOL 220 (H) 01/25/2016   TRIG 77 01/25/2016   HDL 66 01/25/2016   CHOLHDL 3.3 01/25/2016   VLDL 15 01/25/2016   LDLCALC 139 (H) 01/25/2016    See Psychiatric Specialty Exam and Suicide Risk Assessment completed by Attending Physician prior to  discharge.  Discharge destination:  Home  Is patient on multiple antipsychotic therapies at discharge:  No   Has Patient had three or more failed trials of antipsychotic monotherapy by history:  No  Recommended Plan for Multiple Antipsychotic Therapies: NA     Medication List    TAKE these medications     Indication  hydrOXYzine 25 MG tablet Commonly known as:  ATARAX/VISTARIL Take 1 tablet (25 mg total) by mouth every 6 (six) hours as needed for anxiety.  Indication:  Anxiety Neurosis   OLANZapine 2.5 MG tablet Commonly known as:  ZYPREXA Take 1 tablet (2.5 mg total) by mouth at bedtime.  Indication:  mood stabilization   traZODone 50 MG tablet Commonly known as:  DESYREL Take 0.5 tablets (25 mg total) by mouth at bedtime as needed for sleep.  Indication:  Trouble Sleeping       Follow-up recommendations:  Activity:  As tolerated Diet:  Heart healthy with low sodium.  Comments:   Take all medications as prescribed. Keep all follow-up appointments as scheduled.  Do not consume alcohol or use illegal drugs while on prescription medications.  Report any adverse effects from your medications to your primary care provider promptly.  In the event of recurrent symptoms or worsening symptoms, call 911, a crisis hotline, or go to the nearest emergency department for evaluation.   Signed: Beau FannyWithrow, Mendy Chou C, FNP 01/28/2016, 9:32 AM

## 2016-01-28 NOTE — Tx Team (Signed)
Interdisciplinary Treatment and Diagnostic Plan Update  01/28/2016 Time of Session: 10:59 AM  Selena [name] MRN: [id number]  Principal Diagnosis: Bipolar disorder, current episode manic severe with psychotic features [facility](HCC)  Secondary Diagnoses: Principal Problem:   Bipolar disorder, current episode manic severe with psychotic features (HCC)   Current Medications:  Current Facility-Administered Medications  Medication Dose Route Frequency Provider Last Rate Last Dose  . acetaminophen (TYLENOL) tablet 650 mg  650 mg Oral Q4H PRN Charm RingsJamison Y Lord, NP   650 mg at 01/27/16 1111  . alum & mag hydroxide-simeth (MAALOX/MYLANTA) 200-200-20 MG/5ML suspension 30 mL  30 mL Oral Q4H PRN Charm RingsJamison Y Lord, NP      . hydrOXYzine (ATARAX/VISTARIL) tablet 25 mg  25 mg Oral Q6H PRN Jomarie LongsSaramma Eappen, MD      . ibuprofen (ADVIL,MOTRIN) tablet 600 mg  600 mg Oral Q8H PRN Charm RingsJamison Y Lord, NP   600 mg at 01/27/16 2117  . magnesium hydroxide (MILK OF MAGNESIA) suspension 30 mL  30 mL Oral Daily PRN Charm RingsJamison Y Lord, NP   30 mL at 01/25/16 2049  . OLANZapine (ZYPREXA) tablet 5 mg  5 mg Oral TID PRN Jomarie LongsSaramma Eappen, MD       Or  . OLANZapine (ZYPREXA) injection 5 mg  5 mg Intramuscular TID PRN Jomarie LongsSaramma Eappen, MD      . OLANZapine (ZYPREXA) tablet 2.5 mg  2.5 mg Oral QHS Jomarie LongsSaramma Eappen, MD   2.5 mg at 01/27/16 2117  . traZODone (DESYREL) tablet 25 mg  25 mg Oral QHS PRN Jomarie LongsSaramma Eappen, MD        PTA Medications: No prescriptions prior to admission.    Treatment Modalities: Medication Management, Group therapy, Case management,  1 to 1 session with clinician, Psychoeducation, Recreational therapy.  Patient Stressors: Financial difficulties Marital or family conflict  Patient Strengths: Active sense of humor Capable of independent living Wellsite geologistCommunication skills General fund of knowledge  Physician Treatment Plan for Primary Diagnosis: Bipolar disorder, current episode manic severe with psychotic features (HCC) Long  Term Goal(s): Improvement in symptoms so as ready for discharge  Short Term Goals: Ability to identify and develop effective coping behaviors will improve Compliance with prescribed medications will improve Ability to identify and develop effective coping behaviors will improve Compliance with prescribed medications will improve  Medication Management: Evaluate patient's response, side effects, and tolerance of medication regimen.  Therapeutic Interventions: 1 to 1 sessions, Unit Group sessions and Medication administration.  Evaluation of Outcomes: Adequate for Discharge  Physician Treatment Plan for Secondary Diagnosis: Principal Problem:   Bipolar disorder, current episode manic severe with psychotic features (HCC)   Long Term Goal(s): Improvement in symptoms so as ready for discharge  Short Term Goals: Ability to identify and develop effective coping behaviors will improve Compliance with prescribed medications will improve Ability to identify and develop effective coping behaviors will improve Compliance with prescribed medications will improve  Medication Management: Evaluate patient's response, side effects, and tolerance of medication regimen.  Therapeutic Interventions: 1 to 1 sessions, Unit Group sessions and Medication administration.  Evaluation of Outcomes: Adequate for Discharge   RN Treatment Plan for Primary Diagnosis: Bipolar disorder, current episode manic severe with psychotic features (HCC) Long Term Goal(s): Knowledge of disease and therapeutic regimen to maintain health will improve  Short Term Goals: Ability to participate in decision making will improve, Ability to verbalize feelings will improve and Compliance with prescribed medications will improve  Medication Management: RN will administer medications as ordered by provider, will assess  and evaluate patient's response and provide education to patient for prescribed medication. RN will report any adverse  and/or side effects to prescribing provider.  Therapeutic Interventions: 1 on 1 counseling sessions, Psychoeducation, Medication administration, Evaluate responses to treatment, Monitor vital signs and CBGs as ordered, Perform/monitor CIWA, COWS, AIMS and Fall Risk screenings as ordered, Perform wound care treatments as ordered.  Evaluation of Outcomes: Adequate for Discharge   LCSW Treatment Plan for Primary Diagnosis: Bipolar disorder, current episode manic severe with psychotic features (HCC) Long Term Goal(s): Safe transition to appropriate next level of care at discharge, Engage patient in therapeutic group addressing interpersonal concerns.  Short Term Goals: Engage patient in aftercare planning with referrals and resources and Facilitate acceptance of mental health diagnosis and concerns  Therapeutic Interventions: Assess for all discharge needs, 1 to 1 time with Social worker, Explore available resources and support systems, Assess for adequacy in community support network, Educate family and significant other(s) on suicide prevention, Complete Psychosocial Assessment, Interpersonal group therapy.  Evaluation of Outcomes: Adequate for Discharge   Progress in Treatment: Attending groups: Yes Participating in groups: Yes Taking medication as prescribed: Yes, MD continues to assess for medication changes as needed Toleration medication: Yes, no side effects reported at this time Family/Significant other contact made: No, CSW attempting to make contact with Pt's friend Selena [name] Patient understands diagnosis: Limited insight Discussing patient identified problems/goals with staff: Yes Medical problems stabilized or resolved: Yes Denies suicidal/homicidal ideation: Yes Issues/concerns per patient self-inventory: None Other: N/A  New problem(s) identified: None identified at this time.   New Short Term/Long Term Goal(s): None identified at this time.   Discharge Plan or Barriers: Pt  will return home and follow-up with outpatient services. She declined to sign release for [facility].  Stated she was afraid it would mean she could be readmitted to hospital  Reason for Continuation of Hospitalization:   Estimated Length of Stay: D/C today  Attendees: Patient: 01/28/2016  10:59 AM  Physician: Dr. Elna BreslowEappen 01/28/2016  10:59 AM  Nursing: Merian CapronMarian Friedman, RN 01/28/2016  10:59 AM  RN Care Manager: Onnie BoerJennifer Clark, RN 01/28/2016  10:59 AM  Social Worker: Selena Itood Amiaya Mcneeley, LCSW 01/28/2016  10:59 AM  Recreational Therapist:  01/28/2016  10:59 AM  Other: Selena Headonrad Withrow, NP; Selena BernhardtMay Augustin, NP 01/28/2016  10:59 AM  Other:  01/28/2016  10:59 AM  Other: 01/28/2016  10:59 AM    Scribe for Treatment Team: Selena Itood Mahasin Riviere LCSW 01/28/2016 10:59 AM

## 2016-01-28 NOTE — Progress Notes (Signed)
Recreation Therapy Notes  Date: 01/28/16 Time: 1000 Location: 500 Hall Dayroom  Group Topic: Anger Management  Goal Area(s) Addresses:  Patient will identify triggers for anger.  Patient will identify physical reaction to anger.   Patient will identify benefit of using coping skills when angry.  Intervention: Pencils, worksheet  Activity: Anger Umbrella.  Patients were to identify the feelings that are covered up by their anger and write them inside of the umbrella.  After patients identified the feelings, they were to identify coping skills to deal with those feelings a place them on the outside of umbrella.   Education: Anger Management, Discharge Planning   Education Outcome: Acknowledges education/In group clarification offered/Needs additional education.   Clinical Observations/Feedback:  Pt did not attend group.    Caroll RancherMarjette Latricia Cerrito, LRT/CTRS      Caroll RancherLindsay, Zeniah Briney A 01/28/2016 12:37 PM

## 2016-01-28 NOTE — Progress Notes (Signed)
D: Selena [name] has been calm and appropriate this a.m She denies SI/HI/AVH. She denies concerns/needs/questions and verbalizes readiness for discharge.   A: No meds scheduled this a.m. Q15 safety checks maintained. Support/encouragement offered. Discharge paperwork reviewed.    R: Pt remains free from harm and continues with treatment. Will continue to monitor for needs/safety.

## 2016-01-28 NOTE — Progress Notes (Addendum)
Patient was discharged per order. AVS, medications, scripts, transition summary, and med samples were all reviewed with patient. Pt was given an opportunity to ask questions and verbalized understanding of all discharge paperwork. Belongings were returned, and patient signed for receipt. Patient verbalized readiness for discharge and appeared in no acute distress when escorted to lobby.

## 2016-01-28 NOTE — [facility] Group Notes (Signed)
[facility] Group Notes:  (Nursing/MHT/Case Management/Adjunct)  Date:  01/28/2016  Time:  0915  Type of Therapy:  Nurse Education  Participation Level:  Did Not Attend  Summary of Progress/Problems: Pt was invited. Pt did not attend.  Maurine SimmeringShugart, Marik Sedore M 01/28/2016, 10:06 AM

## 2016-01-28 NOTE — Progress Notes (Signed)
  [facility] :  Will you be returning to the same living situation after discharge:  Yes,  home At discharge, do you have transportation home?: Yes,  friend Do you have the ability to pay for your medications: Yes,  mental health  Release of information consent forms completed and in the chart;  Patient's signature needed at discharge.  Patient to Follow up at: Follow-up Information    [facility].   Specialty:  Behavioral Health Why:  Go to the walk-in clinc M-F between 8 and 11AM for your hospital follow up appointment Contact information: [address] [place] [address] [phone]           Next level [facility] care provider [facility]:no  Safety Planning and Suicide Prevention discussed: Yes,  yes  Have you used any form of tobacco in the last 30 days? (Cigarettes, Smokeless Tobacco, Cigars, and/or Pipes): No  Has patient been referred to the Quitline?: N/A patient is not a smoker  Patient has been referred for addiction treatment: N/A  Ida RogueRodney B Dvaughn Fickle 01/28/2016, 10:58 AM

## 2016-01-28 NOTE — [facility] Suicide Risk Assessment (Signed)
[facility]   Principal Problem: Bipolar disorder, current episode manic severe with psychotic features [facility](HCC) Discharge Diagnoses:  Patient Active Problem List   Diagnosis Date Noted  . Bipolar disorder, current episode manic severe with psychotic features (HCC) [F31.2] 01/24/2016    Total Time spent with patient: 30 minutes  Musculoskeletal: Strength & Muscle Tone: within normal limits Gait & Station: normal Patient leans: N/A  Psychiatric Specialty Exam: Review of Systems  Psychiatric/Behavioral: Positive for hallucinations (continues to have AH but is able to cope with it). Negative for depression and suicidal ideas.  All other systems reviewed and are negative.   Blood pressure (!) 181/99, pulse 90, temperature 98.4 F (36.9 C), temperature source Oral, resp. rate 16, height 5\' 3"  (1.6 m), weight 65.3 kg (144 lb), SpO2 100 %.Body mass index is 25.51 kg/m.  General Appearance: Casual  Eye Contact::  Fair  Speech:  Clear and Coherent409  Volume:  Normal  Mood:  Euthymic  Affect:  Appropriate  Thought Process:  Goal Directed and Descriptions of Associations: Intact  Orientation:  Full (Time, Place, and Person)  Thought Content:  Hallucinations: Auditory has AH - but states they are not command and she can cope with it .  Suicidal Thoughts:  No  Homicidal Thoughts:  No  Memory:  Immediate;   Fair Recent;   Fair Remote;   Fair  Judgement:  Fair  Insight:  Fair  Psychomotor Activity:  Normal  Concentration:  Fair  Recall:  [employer]Fair  Fund of Knowledge:Fair  Language: Fair  Akathisia:  No  Handed:  Right  AIMS (if indicated):   0  Assets:  Communication Skills Desire for Improvement  Sleep:  Number of Hours: 6.25  Cognition: WNL  ADL's:  Intact   Mental Status Per Nursing Assessment::   On Admission:  NA  Demographic Factors:  Caucasian  Loss Factors: NA  Historical Factors: Impulsivity  Risk Reduction Factors:   Living with  another person, especially a relative, Positive social support and Positive therapeutic relationship  Continued Clinical Symptoms:  Previous Psychiatric Diagnoses and Treatments  Cognitive Features That Contribute To Risk:  None    Suicide Risk:  Minimal: No identifiable suicidal ideation.  Patients presenting with no risk factors but with morbid ruminations; may be classified as minimal risk based on the severity of the depressive symptoms    Plan Of Care/Follow-up recommendations:  Activity:  no restrictions Diet:  regular Tests:  as needed Other:  patient to follow up with aftercare  Tosh Glaze, MD 01/28/2016, 9:44 AM

## 2016-02-06 LAB — HEPATITIS PANEL, ACUTE

## 2016-02-06 LAB — PROLACTIN: Prolactin: 65.4 ng/mL — ABNORMAL HIGH (ref 4.8–23.3)

## 2016-02-06 LAB — RPR: RPR Ser Ql: NONREACTIVE

## 2018-04-18 IMAGING — CT CT HEAD W/O CM
3 of 4 series · 15 of 47 positions shown, 18 images · non-contrast
Comparison: None.

CLINICAL DATA: Patients friend asked for her to be IVC. States
patient has not been eating or sleeping and wonders. States patient
is hearing voices and talking to imaginary people and saying that
someone is in her head and touching her.

EXAM:
CT HEAD WITHOUT CONTRAST
TECHNIQUE: Contiguous axial images were obtained from the base of the skull
through the vertex without intravenous contrast.

[Series 2: head w/o · axial · non-contrast · 0.45mm/px · z∈[-149,-29]mm · 9 of 30 slices shown, 12 images]
[im 3/30  brain]
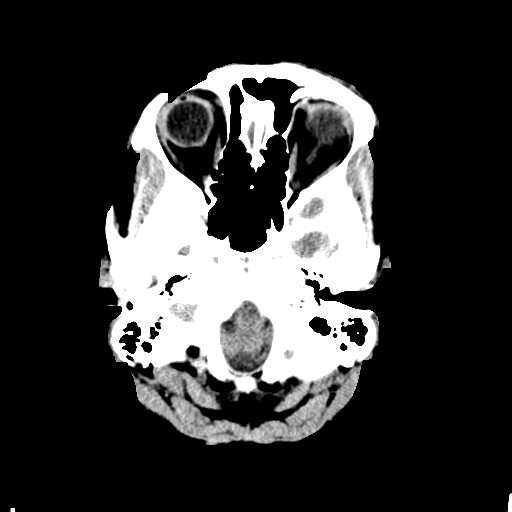
[im 3/30  bone]
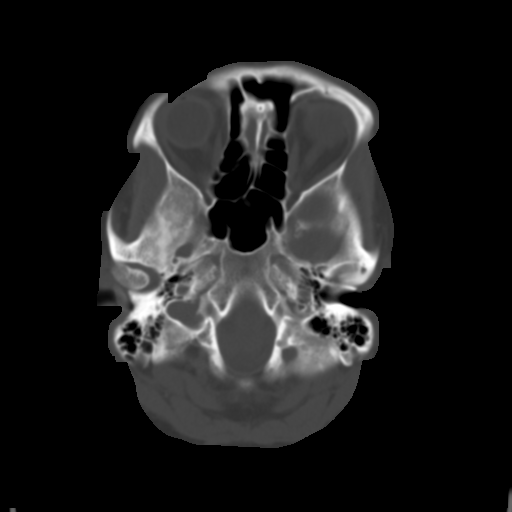
[im 7/30  brain]
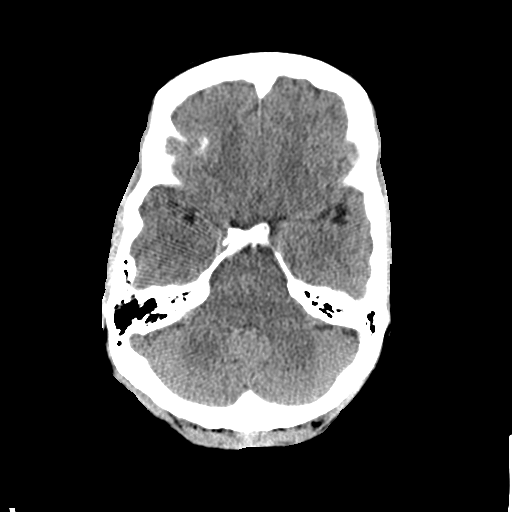
[im 9/30  brain]
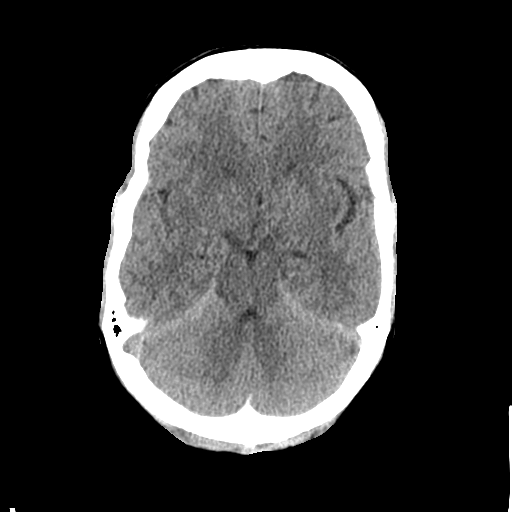
[im 13/30  brain]
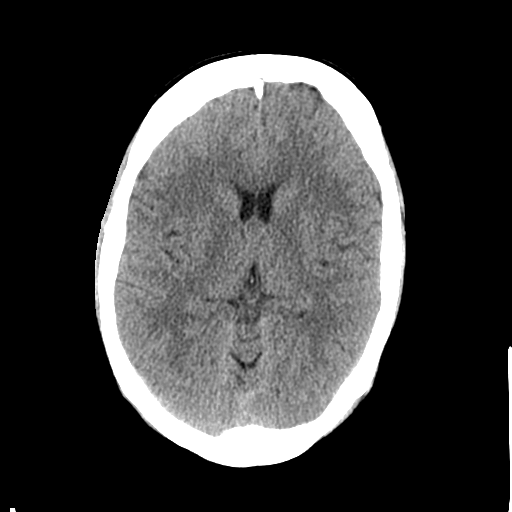
[im 15/30  brain]
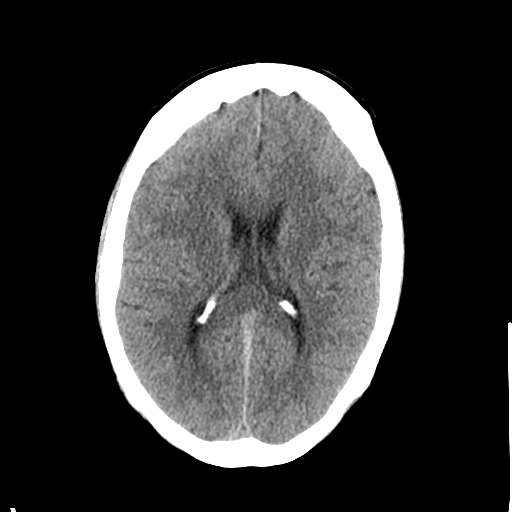
[im 15/30  bone]
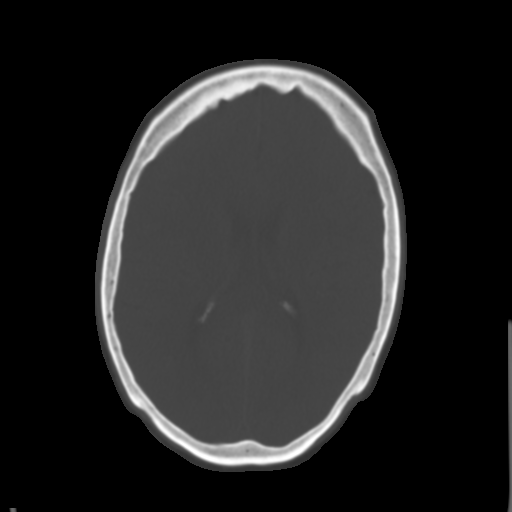
[im 17/30  brain]
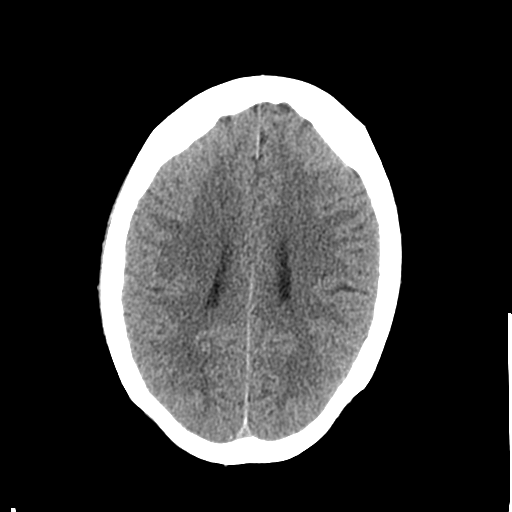
[im 21/30  brain]
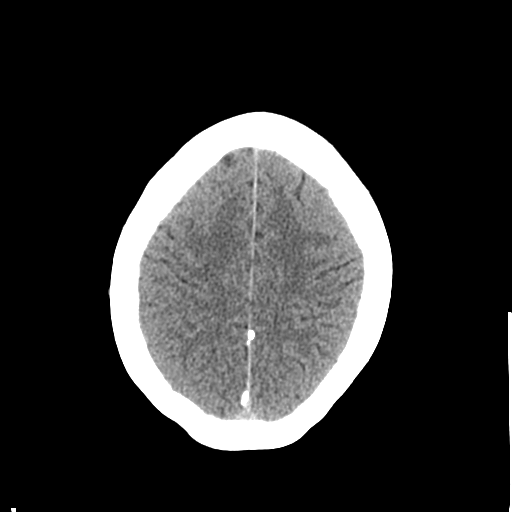
[im 23/30  brain]
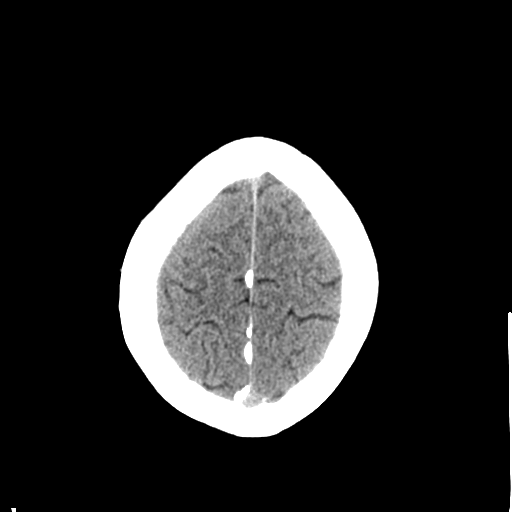
[im 27/30  brain]
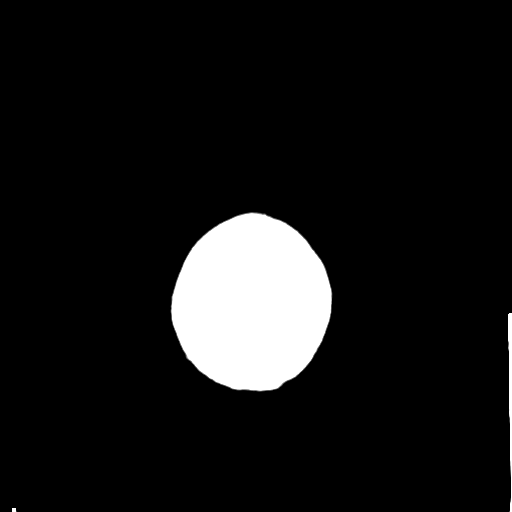
[im 27/30  bone]
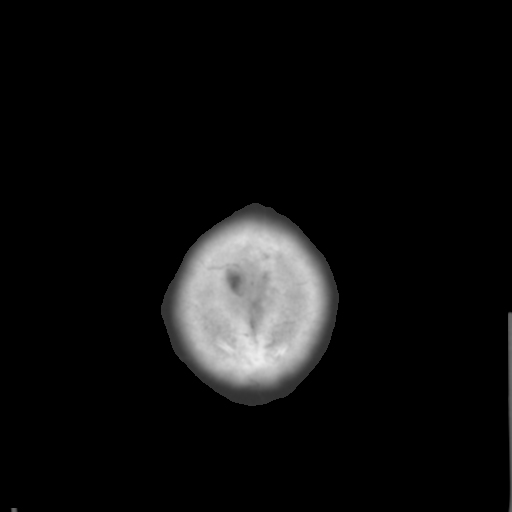

[Series 5: coronal · coronal · 0.29mm/px · 3 of 68 slices shown]
[im 23/68  brain]
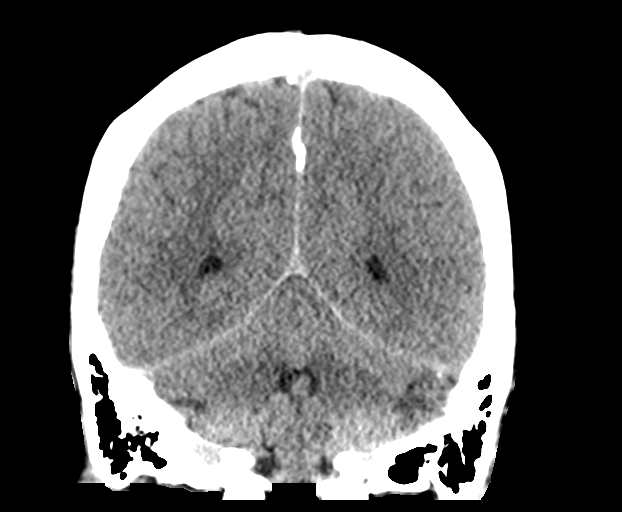
[im 30/68  brain]
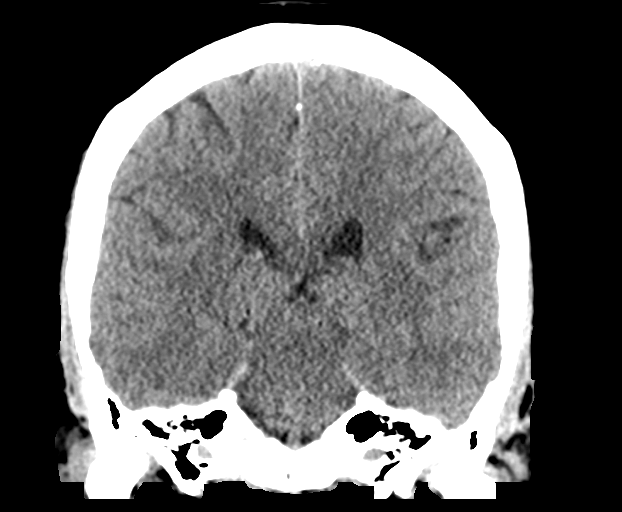
[im 38/68  brain]
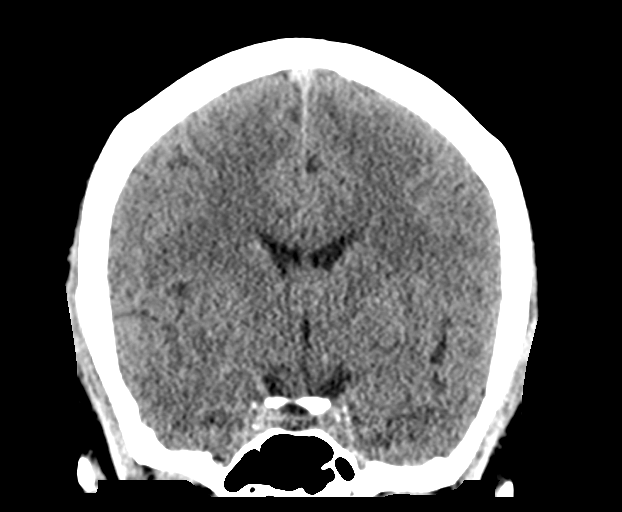

[Series 6: sagittal · sagittal · 0.31mm/px · 3 of 53 slices shown]
[im 18/53  brain]
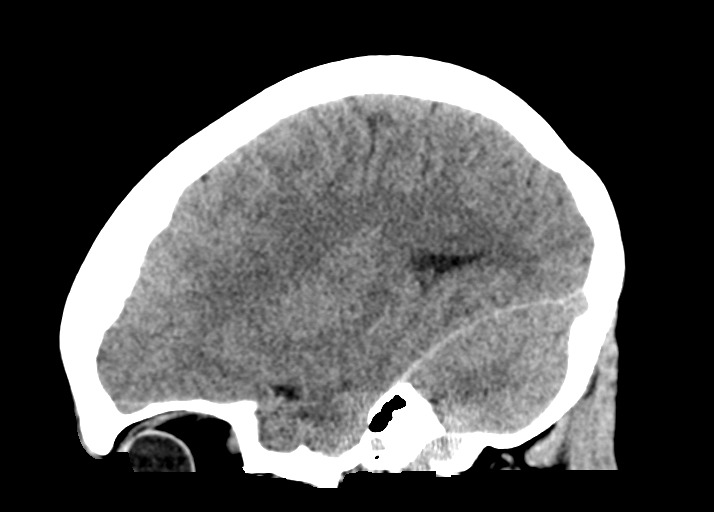
[im 27/53  brain]
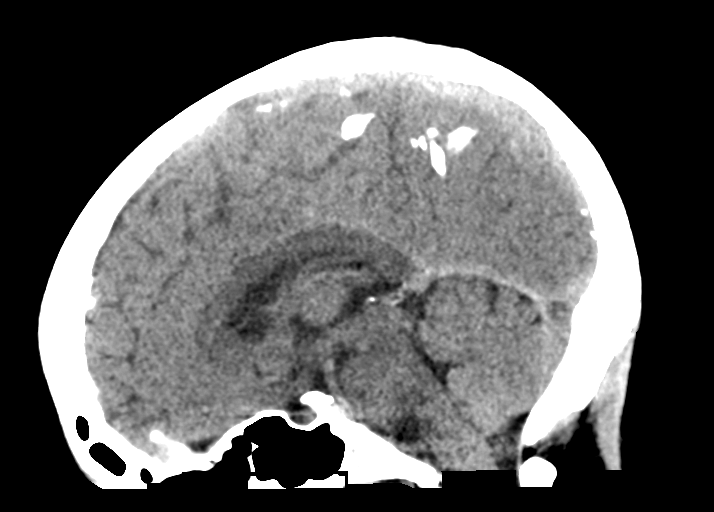
[im 35/53  brain]
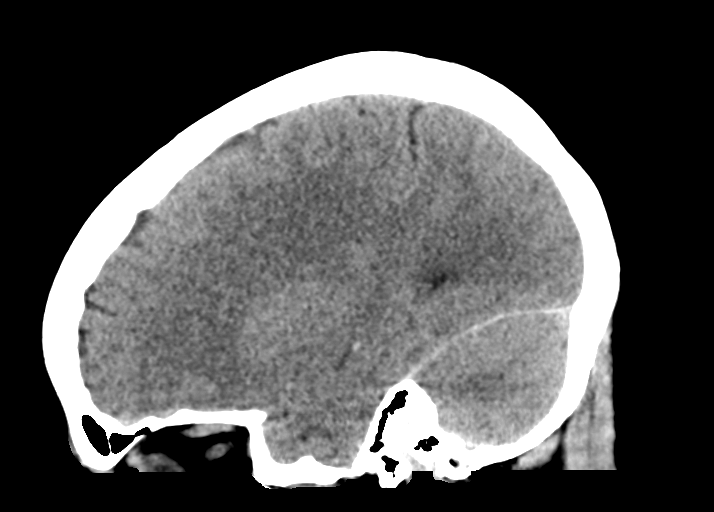

[15 of 47 positions shown; findings below may reference images not displayed]

FINDINGS: Brain: No evidence of acute infarction, hemorrhage, hydrocephalus,
extra-axial collection or mass lesion/mass effect.

Vascular: No hyperdense vessel or unexpected calcification.

Skull: Normal. Negative for fracture or focal lesion.

Sinuses/Orbits: Clear visualized sinuses. Visualize globes and
orbits are unremarkable.

Other: None.
IMPRESSION: Normal unenhanced CT scan of the brain.
